# Patient Record
Sex: Male | Born: 1980 | Race: Asian | Hispanic: No | Marital: Married | Smoking: Former smoker
Health system: Southern US, Community
[De-identification: ages and names within clinical notes are randomized; demographics above are authoritative.]

## PROBLEM LIST (undated history)

## (undated) DIAGNOSIS — K219 Gastro-esophageal reflux disease without esophagitis: Secondary | ICD-10-CM

## (undated) HISTORY — DX: Gastro-esophageal reflux disease without esophagitis: K21.9

## (undated) HISTORY — PX: NO PAST SURGERIES: SHX2092

---

## 2010-12-30 ENCOUNTER — Other Ambulatory Visit (HOSPITAL_COMMUNITY): Payer: Self-pay | Admitting: Family Medicine

## 2010-12-30 DIAGNOSIS — R0789 Other chest pain: Secondary | ICD-10-CM

## 2011-01-07 ENCOUNTER — Ambulatory Visit (HOSPITAL_COMMUNITY)
Admission: RE | Admit: 2011-01-07 | Discharge: 2011-01-07 | Disposition: A | Discharge status: Home / Self Care | Payer: Managed Care, Other (non HMO) | Source: Ambulatory Visit | Attending: Family Medicine | Admitting: Family Medicine

## 2011-01-07 ENCOUNTER — Other Ambulatory Visit (HOSPITAL_COMMUNITY): Payer: Self-pay | Admitting: Family Medicine

## 2011-01-07 DIAGNOSIS — R079 Chest pain, unspecified: Secondary | ICD-10-CM | POA: Insufficient documentation

## 2011-01-07 DIAGNOSIS — R0789 Other chest pain: Secondary | ICD-10-CM

## 2011-01-07 DIAGNOSIS — K449 Diaphragmatic hernia without obstruction or gangrene: Secondary | ICD-10-CM | POA: Insufficient documentation

## 2011-01-07 DIAGNOSIS — R12 Heartburn: Secondary | ICD-10-CM | POA: Insufficient documentation

## 2011-01-07 DIAGNOSIS — K219 Gastro-esophageal reflux disease without esophagitis: Secondary | ICD-10-CM | POA: Insufficient documentation

## 2012-06-06 IMAGING — RF DG UGI W/ HIGH DENSITY W/KUB
15 of 24 series · 15 of 24 positions shown · non-contrast
Comparison: None

CLINICAL DATA: Chest pain.  Heartburn.

UPPER GI SERIES WITH KUB
TECHNIQUE: Routine upper GI series was performed with thin and
high density barium.
Fluoroscopy Time: 1.45 minutes

[Series 1: run · 1 of 1 slices shown (1 of 15)]
[im 1/1]
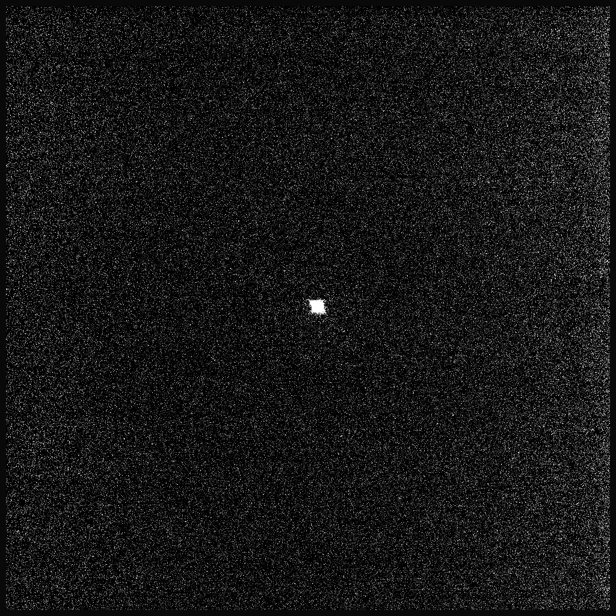

[Series 3: run · 1 of 1 slices shown (2 of 15)]
[im 1/1]
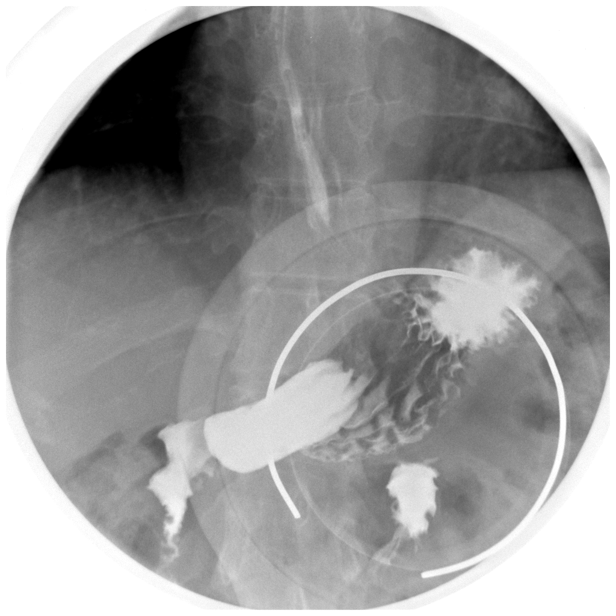

[Series 5: run · 1 of 1 slices shown (3 of 15)]
[im 1/1]
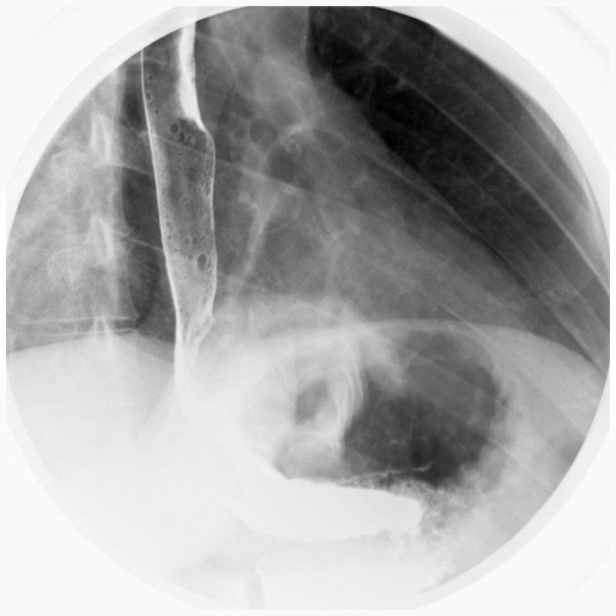

[Series 6: run · 1 of 1 slices shown (4 of 15)]
[im 1/1]
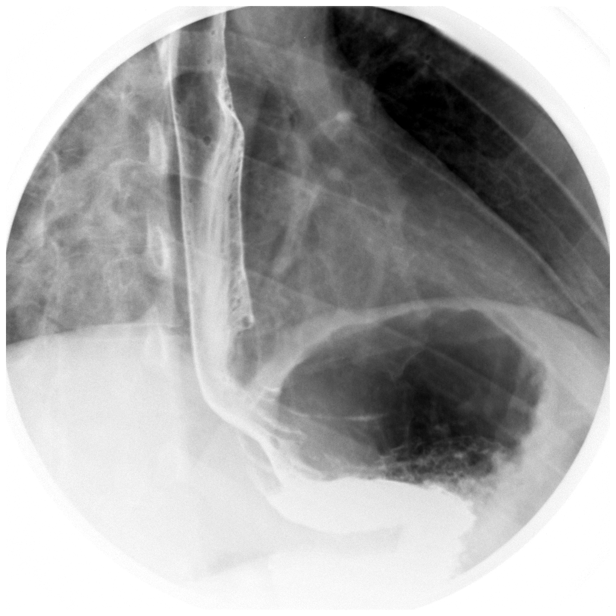

[Series 8: run · 1 of 1 slices shown (5 of 15)]
[im 1/1]
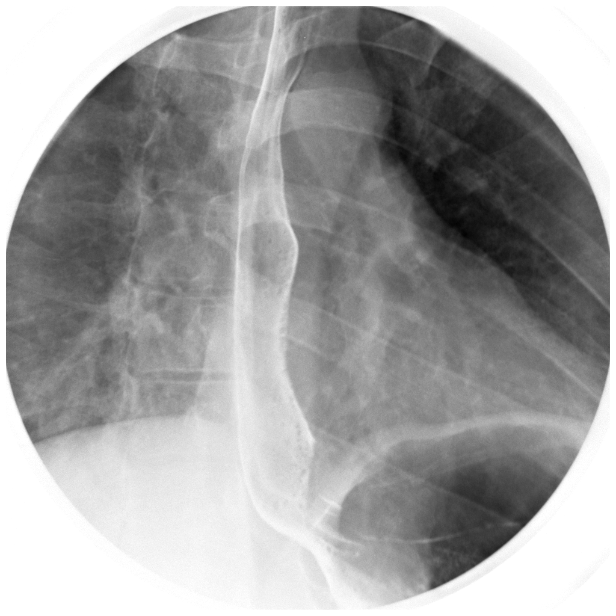

[Series 9: run · 1 of 1 slices shown (6 of 15)]
[im 1/1]
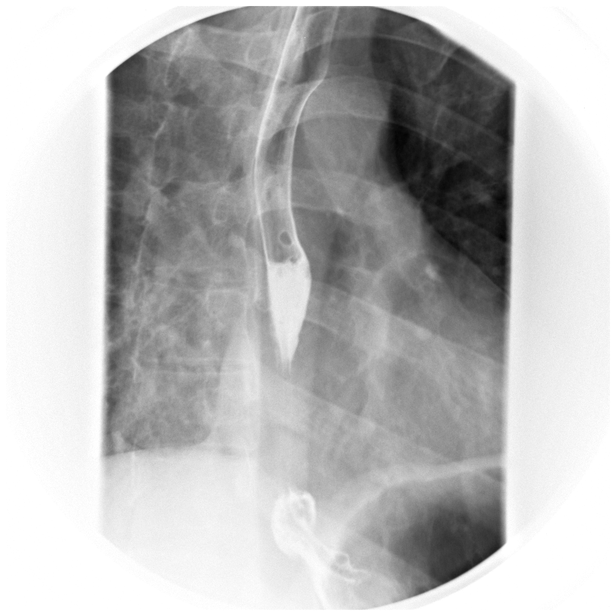

[Series 11: run · 1 of 1 slices shown (7 of 15)]
[im 1/1]
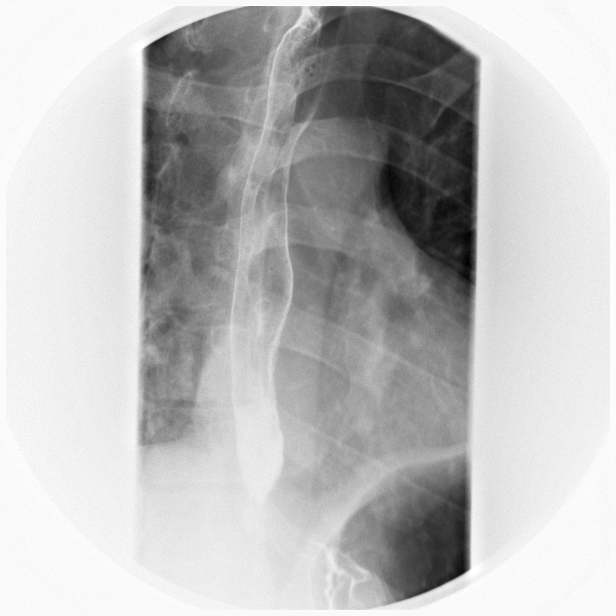

[Series 13: run · 1 of 1 slices shown (8 of 15)]
[im 1/1]
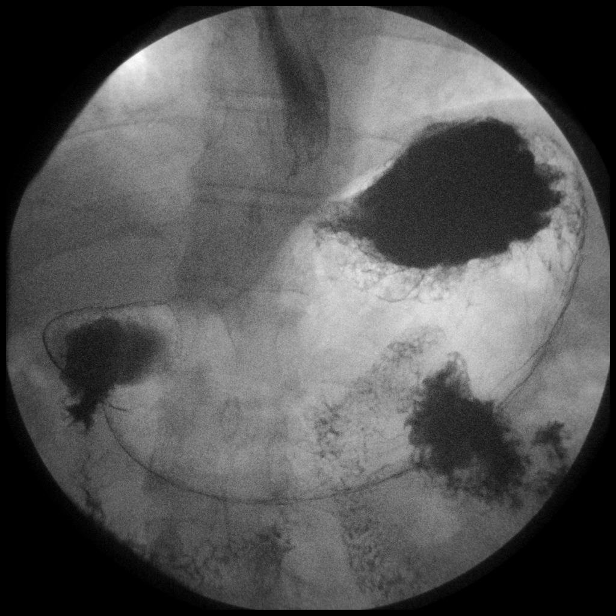

[Series 14: run · 1 of 1 slices shown (9 of 15)]
[im 1/1]
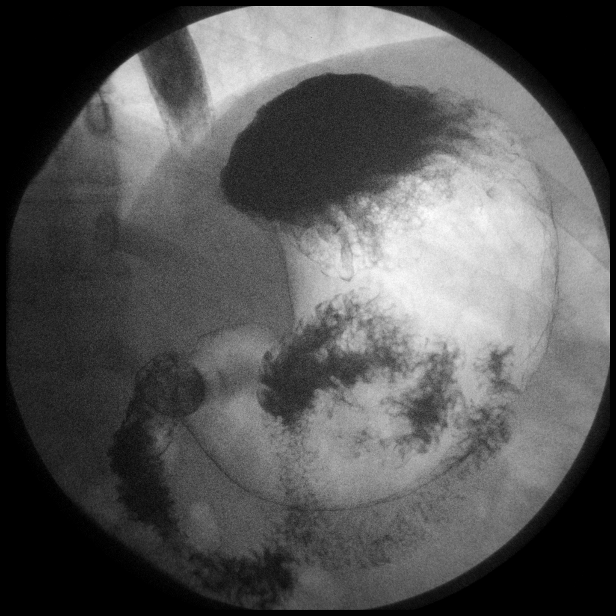

[Series 16: run · 1 of 1 slices shown (10 of 15)]
[im 1/1]
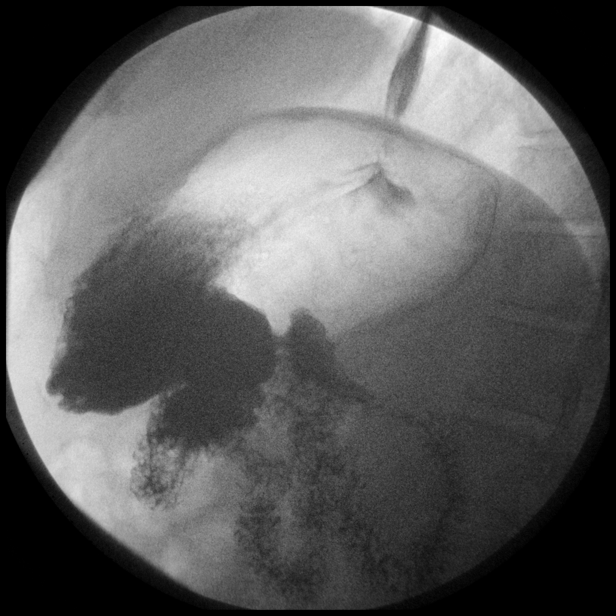

[Series 17: run · 1 of 1 slices shown (11 of 15)]
[im 1/1]
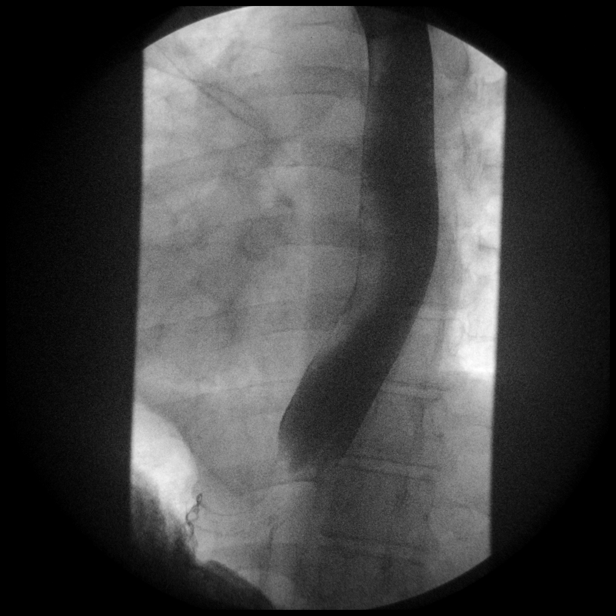

[Series 19: run · 1 of 1 slices shown (12 of 15)]
[im 1/1]
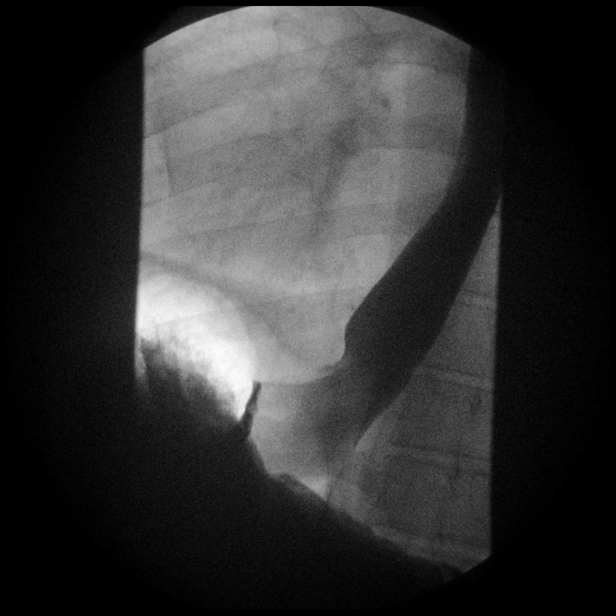

[Series 21: run · 1 of 1 slices shown (13 of 15)]
[im 1/1]
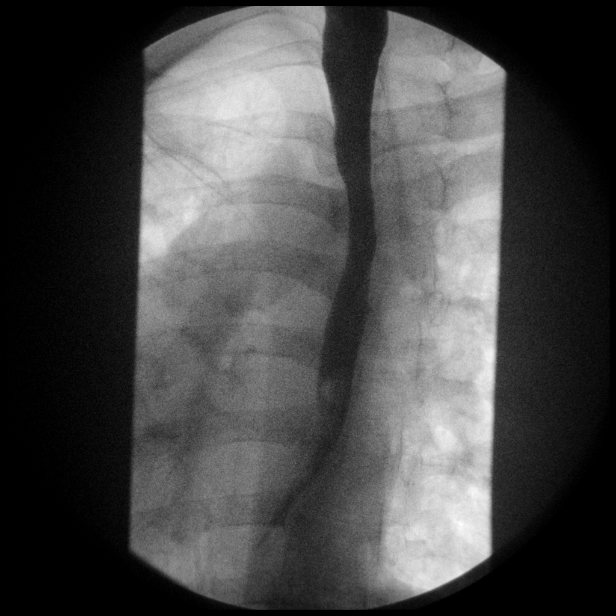

[Series 22: run · 1 of 1 slices shown (14 of 15)]
[im 1/1]
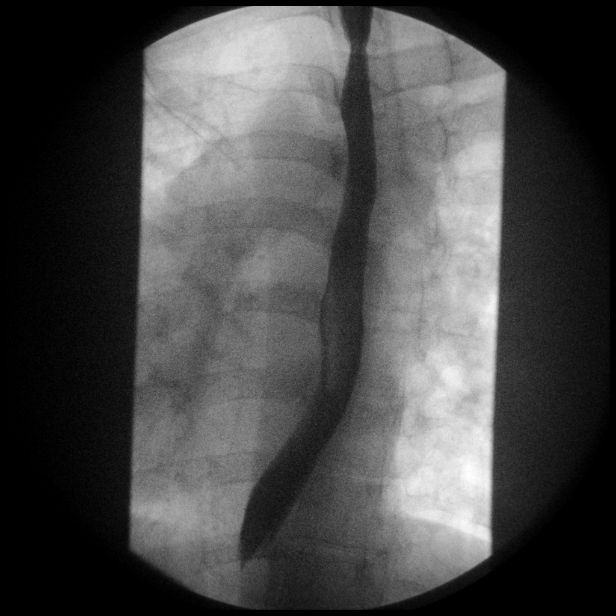

[Series 24: run · 1 of 1 slices shown (15 of 15)]
[im 1/1]
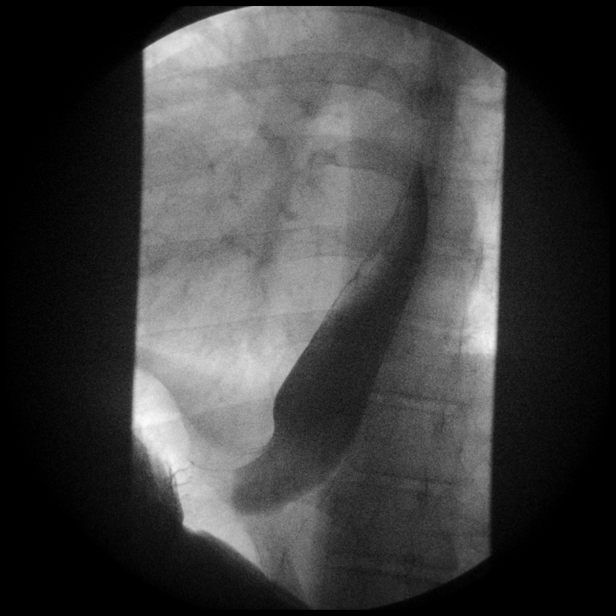

[15 of 24 positions shown; findings below may reference images not displayed]

FINDINGS: Initial barium swallows demonstrate normal esophageal
motility.  No intrinsic or extrinsic lesions of the esophagus are
identified and no mucosal abnormalities are seen.  There is a small
to moderate-sized sliding type hiatal hernia noted with episodes of
spontaneous and inducible GE reflux.

The stomach, duodenal bulb and C-loop are normal.  The duodenal
jejunal junction is in its normal anatomic location and visualized
small bowel loops are normal.
IMPRESSION: Small to moderate-sized sliding type hiatal hernia with spontaneous
and inducible GE reflux.

## 2017-08-02 ENCOUNTER — Ambulatory Visit: Payer: Managed Care, Other (non HMO) | Admitting: Family Medicine

## 2017-08-07 ENCOUNTER — Ambulatory Visit (INDEPENDENT_AMBULATORY_CARE_PROVIDER_SITE_OTHER): Payer: Managed Care, Other (non HMO) | Admitting: Family Medicine

## 2017-08-07 ENCOUNTER — Encounter: Payer: Self-pay | Admitting: Family Medicine

## 2017-08-07 VITALS — BP 104/76 | HR 78 | Temp 98.3°F | Ht 71.0 in | Wt 207.5 lb

## 2017-08-07 DIAGNOSIS — Z23 Encounter for immunization: Secondary | ICD-10-CM | POA: Diagnosis not present

## 2017-08-07 DIAGNOSIS — Z114 Encounter for screening for human immunodeficiency virus [HIV]: Secondary | ICD-10-CM | POA: Diagnosis not present

## 2017-08-07 DIAGNOSIS — L308 Other specified dermatitis: Secondary | ICD-10-CM | POA: Diagnosis not present

## 2017-08-07 DIAGNOSIS — Z0000 Encounter for general adult medical examination without abnormal findings: Secondary | ICD-10-CM | POA: Diagnosis not present

## 2017-08-07 LAB — COMPREHENSIVE METABOLIC PANEL
ALBUMIN: 4.3 g/dL (ref 3.5–5.2)
ALT: 36 U/L (ref 0–53)
AST: 24 U/L (ref 0–37)
Alkaline Phosphatase: 50 U/L (ref 39–117)
BILIRUBIN TOTAL: 0.9 mg/dL (ref 0.2–1.2)
BUN: 18 mg/dL (ref 6–23)
CALCIUM: 9.4 mg/dL (ref 8.4–10.5)
CO2: 28 mEq/L (ref 19–32)
CREATININE: 1.13 mg/dL (ref 0.40–1.50)
Chloride: 104 mEq/L (ref 96–112)
GFR: 77.48 mL/min (ref >60.00)
Glucose, Bld: 86 mg/dL (ref 70–99)
Potassium: 4.2 mEq/L (ref 3.5–5.1)
Sodium: 139 mEq/L (ref 135–145)
TOTAL PROTEIN: 7.4 g/dL (ref 6.0–8.3)

## 2017-08-07 LAB — LIPID PANEL
CHOLESTEROL: 155 mg/dL (ref 0–200)
HDL: 31.9 mg/dL — ABNORMAL LOW (ref >39.00)
LDL Cholesterol: 87 mg/dL (ref 0–99)
NonHDL: 123.45
TRIGLYCERIDES: 183 mg/dL — AB (ref 0.0–149.0)
Total CHOL/HDL Ratio: 5
VLDL: 36.6 mg/dL (ref 0.0–40.0)

## 2017-08-07 MED ORDER — TRIAMCINOLONE ACETONIDE 0.1 % EX OINT: 1.0000 "application " | TOPICAL_OINTMENT | Freq: Two times a day (BID) | CUTANEOUS | 0 refills | Status: AC

## 2017-08-07 MED ORDER — OMEPRAZOLE 20 MG PO CPDR: 20.0000 mg | DELAYED_RELEASE_CAPSULE | Freq: Every day | ORAL | 3 refills | Status: DC

## 2017-08-07 NOTE — Progress Notes (Signed)
Chief Complaint  Patient presents with  . New Patient (Initial Visit)    [name] is here for a complete physical.   His last physical was >1 year ago.  Current diet: in general, a "healthy" diet.   Current exercise: 1-2 x/week will do some cardio Weight trend: stable Does pt snore? Yes. No apneic episodes. Daytime fatigue? No. Seat belt? Yes.    Health maintenance Tetanus- No HIV- No  Past Medical History:  Diagnosis Date  . GERD (gastroesophageal reflux disease)      Past Surgical History:  Procedure Laterality Date  . NO PAST SURGERIES      Medications  Omeprazole 20 mg/d   Allergies No Known Allergies  Family History Family History  Problem Relation Age of Onset  . Diabetes Mother   . Hypertension Father     Review of Systems: Constitutional: no fevers or chills Eye:  no recent significant change in vision Ear/Nose/Mouth/Throat:  Ears:  no tinnitus or hearing loss Nose/Mouth/Throat:  no complaints of nasal congestion, no sore throat Cardiovascular:  no chest pain, no palpitations Respiratory:  no cough and no shortness of breath Gastrointestinal:  no abdominal pain, no change in bowel habits GU:  Male: negative for dysuria, frequency, and incontinence and negative for prostate symptoms Musculoskeletal/Extremities:  no pain, redness, or swelling of the joints Integumentary (Skin/Breast): +eczema on lower back; no abnormal skin lesions reported Neurologic:  no headaches, no numbness, tingling Endocrine: No unexpected weight changes Hematologic/Lymphatic:  no night sweats  Exam BP 104/76 (BP Location: Left Arm, Patient Position: Sitting, Cuff Size: Normal)   Pulse 78   Temp 98.3 F (36.8 C) (Oral)   Ht 5\' 11"  (1.803 m)   Wt 207 lb 8 oz (94.1 kg)   SpO2 97%   BMI 28.94 kg/m  General:  well developed, well nourished, in no apparent distress Skin: +hyperpigmented and scaly patch over cephalad intergluteal cleft; otherwise no significant  moles, warts, or growths Head:  no masses, lesions, or tenderness Eyes:  pupils equal and round, sclera anicteric without injection Ears:  canals without lesions, TMs shiny without retraction, no obvious effusion, no erythema Nose:  nares patent, septum midline, mucosa normal Throat/Pharynx:  lips and gingiva without lesion; tongue and uvula midline; non-inflamed pharynx; no exudates or postnasal drainage Neck: neck supple without adenopathy, thyromegaly, or masses Lungs:  clear to auscultation, breath sounds equal bilaterally, no respiratory distress Cardio:  regular rate and rhythm, no bruits, no LE edema Abdomen:  abdomen soft, nontender; bowel sounds normal; no masses or organomegaly Genital (male): Nml penis, no lesions or discharge; testes present bilaterally without masses or tenderness Rectal: Deferred Musculoskeletal:  symmetrical muscle groups noted without atrophy or deformity Extremities:  no clubbing, cyanosis, or edema, no deformities, no skin discoloration Neuro:  gait normal; deep tendon reflexes normal and symmetric Psych: well oriented with normal range of affect and appropriate judgment/insight  Assessment and Plan  Well adult exam - Plan: Lipid panel, Comprehensive metabolic panel  Screening for HIV (human immunodeficiency virus) - Plan: HIV antibody  Other eczema - Plan: triamcinolone ointment (KENALOG) 0.1 %  Need for tetanus booster - Plan: Tdap vaccine greater than or equal to 7yo IM   Well 37 y.o. male. Counseled on diet and exercise.  Other orders as above. Follow up in 1 year pending the above workup. The patient voiced understanding and agreement to the plan.  Jilda Rocheicholas Paul [place], DO 08/07/17 9:57 AM

## 2017-08-07 NOTE — Patient Instructions (Addendum)
Give us 2-3 business days to get the results of your labs back. If labs are normal, you will likely receive a letter in the mail unless you have MyChart. This can take longer than 2-3 business days.   Do monthly self testicular checks in the shower. You are feeling for lumps/bumps that don't belong. If you feel anything like this, let me know!  Keep the diet clean.  Aim to do some physical exertion for 150 minutes per week. This is typically divided into 5 days per week, 30 minutes per day. The activity should be enough to get your heart rate up. Anything is better than nothing if you have time constraints.  Let us know if you need anything.

## 2017-08-07 NOTE — Progress Notes (Signed)
Pre visit review using our clinic review tool, if applicable. No additional management support is needed unless otherwise documented below in the visit note. 

## 2017-08-08 LAB — HIV ANTIBODY (ROUTINE TESTING W REFLEX): HIV: NONREACTIVE

## 2018-08-26 ENCOUNTER — Other Ambulatory Visit: Payer: Self-pay | Admitting: Family Medicine

## 2018-09-06 ENCOUNTER — Other Ambulatory Visit: Payer: Self-pay | Admitting: Family Medicine

## 2018-09-13 ENCOUNTER — Telehealth: Payer: Self-pay | Admitting: Family Medicine

## 2018-09-26 ENCOUNTER — Other Ambulatory Visit: Payer: Self-pay

## 2018-09-27 ENCOUNTER — Other Ambulatory Visit: Payer: Self-pay | Admitting: Family Medicine

## 2018-09-27 ENCOUNTER — Ambulatory Visit (INDEPENDENT_AMBULATORY_CARE_PROVIDER_SITE_OTHER): Payer: Managed Care, Other (non HMO) | Admitting: Family Medicine

## 2018-09-27 ENCOUNTER — Encounter: Payer: Self-pay | Admitting: Family Medicine

## 2018-09-27 VITALS — BP 102/80 | HR 71 | Temp 97.5°F | Ht 71.0 in | Wt 206.2 lb

## 2018-09-27 DIAGNOSIS — Z23 Encounter for immunization: Secondary | ICD-10-CM

## 2018-09-27 DIAGNOSIS — E785 Hyperlipidemia, unspecified: Secondary | ICD-10-CM

## 2018-09-27 DIAGNOSIS — Z0000 Encounter for general adult medical examination without abnormal findings: Secondary | ICD-10-CM | POA: Diagnosis not present

## 2018-09-27 DIAGNOSIS — L219 Seborrheic dermatitis, unspecified: Secondary | ICD-10-CM | POA: Diagnosis not present

## 2018-09-27 LAB — CBC
HCT: 42.5 % (ref 39.0–52.0)
Hemoglobin: 14.3 g/dL (ref 13.0–17.0)
MCHC: 33.7 g/dL (ref 30.0–36.0)
MCV: 79.8 fl (ref 78.0–100.0)
Platelets: 361 10*3/uL (ref 150.0–400.0)
RBC: 5.33 Mil/uL (ref 4.22–5.81)
RDW: 14.4 % (ref 11.5–15.5)
WBC: 6.4 10*3/uL (ref 4.0–10.5)

## 2018-09-27 LAB — LIPID PANEL
Cholesterol: 161 mg/dL (ref 0–200)
HDL: 31.6 mg/dL — ABNORMAL LOW (ref >39.00)
NonHDL: 129.29
Total CHOL/HDL Ratio: 5
Triglycerides: 239 mg/dL — ABNORMAL HIGH (ref 0.0–149.0)
VLDL: 47.8 mg/dL — ABNORMAL HIGH (ref 0.0–40.0)

## 2018-09-27 LAB — COMPREHENSIVE METABOLIC PANEL
ALT: 41 U/L (ref 0–53)
AST: 22 U/L (ref 0–37)
Albumin: 4.3 g/dL (ref 3.5–5.2)
Alkaline Phosphatase: 64 U/L (ref 39–117)
BUN: 19 mg/dL (ref 6–23)
CO2: 29 mEq/L (ref 19–32)
Calcium: 9.9 mg/dL (ref 8.4–10.5)
Chloride: 105 mEq/L (ref 96–112)
Creatinine, Ser: 1.21 mg/dL (ref 0.40–1.50)
GFR: 66.96 mL/min (ref >60.00)
Glucose, Bld: 87 mg/dL (ref 70–99)
Potassium: 4 mEq/L (ref 3.5–5.1)
Sodium: 141 mEq/L (ref 135–145)
Total Bilirubin: 0.7 mg/dL (ref 0.2–1.2)
Total Protein: 7.3 g/dL (ref 6.0–8.3)

## 2018-09-27 LAB — LDL CHOLESTEROL, DIRECT: Direct LDL: 119 mg/dL

## 2018-09-27 MED ORDER — KETOCONAZOLE 2 % EX SHAM: 1.0000 "application " | MEDICATED_SHAMPOO | CUTANEOUS | 2 refills | Status: DC

## 2018-09-27 MED ORDER — OMEPRAZOLE 20 MG PO CPDR
20.0000 mg | DELAYED_RELEASE_CAPSULE | Freq: Every day | ORAL | 3 refills | Status: DC
Start: 2018-09-27 — End: 2019-10-22

## 2018-09-27 NOTE — Patient Instructions (Addendum)
Give [place] 2-3 business days to get the results of your labs back.   Keep the diet clean and stay active.  Do monthly self testicular checks in the shower. You are feeling for lumps/bumps that don't belong. If you feel anything like this, let me know!  Pepcid (famotidine) 20 mg 1-2 times daily as an alternative to the Prilosec could be helpful.   After the issue on your scalp resolves, you may need to use the shampoo weekly for maintenance.   Let us know if you need anything.

## 2018-09-27 NOTE — Progress Notes (Signed)
Chief Complaint  Patient presents with  . Annual Exam    heart burn    [name] is here for a complete physical.   His last physical was >1 year ago.  Current diet: in general, diet had been better  Current exercise: not much exercise lately, was doing cardio Weight trend: had lost some wt and gained it back Daytime fatigue? No. Seat belt? Yes.    Health maintenance Tetanus- Yes HIV- Yes  Past Medical History:  Diagnosis Date  . GERD (gastroesophageal reflux disease)      Past Surgical History:  Procedure Laterality Date  . NO PAST SURGERIES      Medications  Current Outpatient Medications on File Prior to Visit  Medication Sig Dispense Refill  . omeprazole (PRILOSEC) 20 MG capsule Take 1 capsule (20 mg total) by mouth daily. Needs ov before any more refills 90 capsule 0   Allergies No Known Allergies  Family History Family History  Problem Relation Age of Onset  . Diabetes Mother   . Hypertension Father     Review of Systems: Constitutional: no fevers or chills Eye:  no recent significant change in vision Ear/Nose/Mouth/Throat:  Ears:  no tinnitus or hearing loss Nose/Mouth/Throat:  no complaints of nasal congestion, no sore throat Cardiovascular:  no chest pain, no palpitations Respiratory:  no cough and no shortness of breath Gastrointestinal:  no abdominal pain, no change in bowel habits GU:  Male: negative for dysuria, frequency, and incontinence and negative for prostate symptoms Musculoskeletal/Extremities:  no pain, redness, or swelling of the joints Integumentary (Skin/Breast): +scaly lesion on scalp; otherwise no abnormal skin lesions reported Neurologic:  No current headache Endocrine: No unexpected weight changes Hematologic/Lymphatic:  no night sweats  Exam BP 102/80 (BP Location: Left Arm, Patient Position: Sitting, Cuff Size: Normal)   Pulse 71   Temp (!) 97.5 F (36.4 C) (Temporal)   Ht 5\' 11"  (1.803 m)   Wt 206 lb 4 oz  (93.6 kg)   SpO2 98%   BMI 28.77 kg/m  General:  well developed, well nourished, in no apparent distress Skin: Thickened and mildly hyperpigmented patch over R side of scalp without current scaling or drainage; otherwise no significant moles, warts, or growths Head:  no masses, lesions, or tenderness Eyes:  pupils equal and round, sclera anicteric without injection Ears:  canals without lesions, TMs shiny without retraction, no obvious effusion, no erythema Nose:  nares patent, septum midline, mucosa normal Throat/Pharynx:  lips and gingiva without lesion; tongue and uvula midline; non-inflamed pharynx; no exudates or postnasal drainage Neck: neck supple without adenopathy, thyromegaly, or masses Lungs:  clear to auscultation, breath sounds equal bilaterally, no respiratory distress Cardio:  regular rate and rhythm, no bruits, no LE edema Abdomen:  abdomen soft, nontender; bowel sounds normal; no masses or organomegaly Genital (male): circumcised penis, no lesions or discharge; testes present bilaterally without masses or tenderness Rectal: Deferred Musculoskeletal:  symmetrical muscle groups noted without atrophy or deformity Extremities:  no clubbing, cyanosis, or edema, no deformities, no skin discoloration Neuro:  gait normal; deep tendon reflexes normal and symmetric Psych: well oriented with normal range of affect and appropriate judgment/insight  Assessment and Plan  Well adult exam - Plan: CBC, Comprehensive metabolic panel, Lipid panel  Seborrheic dermatitis of scalp - Plan: ketoconazole (NIZORAL) 2 % shampoo   Well 38 y.o. male. Counseled on diet and exercise. Self testicular checks rec'd.  Other orders as above. Follow up in 1 year pending the  above workup. The patient voiced understanding and agreement to the plan.  Harold Rocheicholas Paul Rodri[place], DO 09/27/18 8:04 AM

## 2018-11-08 ENCOUNTER — Other Ambulatory Visit: Payer: Self-pay

## 2018-11-08 ENCOUNTER — Other Ambulatory Visit: Payer: Managed Care, Other (non HMO)

## 2018-11-08 ENCOUNTER — Other Ambulatory Visit (INDEPENDENT_AMBULATORY_CARE_PROVIDER_SITE_OTHER): Payer: Managed Care, Other (non HMO)

## 2018-11-08 DIAGNOSIS — E785 Hyperlipidemia, unspecified: Secondary | ICD-10-CM

## 2018-11-08 LAB — LIPID PANEL
Cholesterol: 163 mg/dL (ref 0–200)
HDL: 30.6 mg/dL — ABNORMAL LOW (ref >39.00)
LDL Cholesterol: 97 mg/dL (ref 0–99)
NonHDL: 132.01
Total CHOL/HDL Ratio: 5
Triglycerides: 177 mg/dL — ABNORMAL HIGH (ref 0.0–149.0)
VLDL: 35.4 mg/dL (ref 0.0–40.0)

## 2019-09-09 ENCOUNTER — Encounter: Payer: Self-pay | Admitting: Family Medicine

## 2019-09-09 ENCOUNTER — Other Ambulatory Visit: Payer: Self-pay

## 2019-09-09 ENCOUNTER — Ambulatory Visit (INDEPENDENT_AMBULATORY_CARE_PROVIDER_SITE_OTHER): Payer: Managed Care, Other (non HMO) | Admitting: Family Medicine

## 2019-09-09 VITALS — BP 110/82 | HR 83 | Temp 98.1°F | Ht 71.0 in | Wt 207.0 lb

## 2019-09-09 DIAGNOSIS — M25562 Pain in left knee: Secondary | ICD-10-CM | POA: Diagnosis not present

## 2019-09-09 NOTE — Progress Notes (Signed)
Musculoskeletal Exam  Patient: Harold Molina DOB: September 07, 1980  DOS: 09/09/2019  SUBJECTIVE:  Chief Complaint:   Chief Complaint  Patient presents with  . Knee Pain    left    Harold Molina is a 39 y.o.  male for evaluation and treatment of L knee pain.   Onset:  7 months ago. No inj or change in activity.  Location: lateral left knee Character:  sharp  Progression of issue:  has worsened quite a bit recently over past week Associated symptoms: no redness, bruising, swelling, decreased ROM Treatment: to date has been rest.   Neurovascular symptoms: no  Past Medical History:  Diagnosis Date  . GERD (gastroesophageal reflux disease)     Objective: VITAL SIGNS: BP 110/82 (BP Location: Left Arm, Patient Position: Sitting, Cuff Size: Normal)   Pulse 83   Temp 98.1 F (36.7 C) (Oral)   Ht 5\' 11"  (1.803 m)   Wt 207 lb (93.9 kg)   SpO2 99%   BMI 28.87 kg/m  Constitutional: Well formed, well developed. No acute distress. Thorax & Lungs: No accessory muscle use Musculoskeletal: L knee.   Normal active range of motion: yes.   Normal passive range of motion: yes Tenderness to palpation: no Deformity: no Ecchymosis: no Tests positive: None Tests negative: Lachman's, varus/valgus stress, pat app/grind; McMurray's induced pain but no click noted Neurologic: Normal sensory function. Gait nml.  Psychiatric: Normal mood. Age appropriate judgment and insight. Alert & oriented x 3.    Assessment:  Left knee pain, unspecified chronicity  Plan: Stretches/exercises, heat, ice, Tylenol. Suspect PFS. If no improvement over next 3-4 weeks, he will let know and I will refer to sports med for possible [place].  F/u as originally scheduled. The patient voiced understanding and agreement to the plan.   [place] Weston, DO 09/09/19  1:44 PM

## 2019-09-09 NOTE — Patient Instructions (Signed)
Ibuprofen 400-600 mg (2-3 over the counter strength tabs) every 6 hours as needed for pain.  OK to take Tylenol 1000 mg (2 extra strength tabs) or 975 mg (3 regular strength tabs) every 6 hours as needed.  Ice/cold pack over area for 10-15 min twice daily.  Heat (pad or rice pillow in microwave) over affected area, 10-15 minutes twice daily.   Knee Exercises It is normal to feel mild stretching, pulling, tightness, or discomfort as you do these exercises, but you should stop right away if you feel sudden pain or your pain gets worse. STRETCHING AND RANGE OF MOTION EXERCISES  These exercises warm up your muscles and joints and improve the movement and flexibility of your knee. These exercises also help to relieve pain, numbness, and tingling. Exercise A: Knee Extension, Prone  1. Lie on your abdomen on a bed. 2. Place your left / right knee just beyond the edge of the surface so your knee is not on the bed. You can put a towel under your left / right thigh just above your knee for comfort. 3. Relax your leg muscles and allow gravity to straighten your knee. You should feel a stretch behind your left / right knee. 4. Hold this position for 30 seconds. 5. Scoot up so your knee is supported between repetitions. Repeat 2 times. Complete this stretch 3 times per week. Exercise B: Knee Flexion, Active    1. Lie on your back with both knees straight. If this causes back discomfort, bend your left / right knee so your foot is flat on the floor. 2. Slowly slide your left / right heel back toward your buttocks until you feel a gentle stretch in the front of your knee or thigh. 3. Hold this position for 30 seconds. 4. Slowly slide your left / right heel back to the starting position. Repeat 2 times. Complete this exercise 3 times per week. Exercise C: Quadriceps, Prone    1. Lie on your abdomen on a firm surface, such as a bed or padded floor. 2. Bend your left / right knee and hold your ankle.  If you cannot reach your ankle or pant leg, loop a belt around your foot and grab the belt instead. 3. Gently pull your heel toward your buttocks. Your knee should not slide out to the side. You should feel a stretch in the front of your thigh and knee. 4. Hold this position for 30 seconds. Repeat 2 times. Complete this stretch 3 times per week. Exercise D: Hamstring, Supine  1. Lie on your back. 2. Loop a belt or towel over the ball of your left / right foot. The ball of your foot is on the walking surface, right under your toes. 3. Straighten your left / right knee and slowly pull on the belt to raise your leg until you feel a gentle stretch behind your knee. ? Do not let your left / right knee bend while you do this. ? Keep your other leg flat on the floor. 4. Hold this position for 30 seconds. Repeat 2 times. Complete this stretch 3 times per week. STRENGTHENING EXERCISES  These exercises build strength and endurance in your knee. Endurance is the ability to use your muscles for a long time, even after they get tired. Exercise E: Quadriceps, Isometric    1. Lie on your back with your left / right leg extended and your other knee bent. Put a rolled towel or small pillow under your knee if  told by your health care provider. 2. Slowly tense the muscles in the front of your left / right thigh. You should see your kneecap slide up toward your hip or see increased dimpling just above the knee. This motion will push the back of the knee toward the floor. 3. For 3 seconds, keep the muscle as tight as you can without increasing your pain. 4. Relax the muscles slowly and completely. Repeat for 10 total reps Repeat 2 ti mes. Complete this exercise 3 times per week. Exercise F: Straight Leg Raises - Quadriceps  1. Lie on your back with your left / right leg extended and your other knee bent. 2. Tense the muscles in the front of your left / right thigh. You should see your kneecap slide up or see  increased dimpling just above the knee. Your thigh may even shake a bit. 3. Keep these muscles tight as you raise your leg 4-6 inches (10-15 cm) off the floor. Do not let your knee bend. 4. Hold this position for 3 seconds. 5. Keep these muscles tense as you lower your leg. 6. Relax your muscles slowly and completely after each repetition. 10 total reps. Repeat 2 times. Complete this exercise 3 times per week.  Exercise G: Hamstring Curls    If told by your health care provider, do this exercise while wearing ankle weights. Begin with 5 lb weights (optional). Then increase the weight by 1 lb (0.5 kg) increments. Do not wear ankle weights that are more than 20 lbs to start with. 1. Lie on your abdomen with your legs straight. 2. Bend your left / right knee as far as you can without feeling pain. Keep your hips flat against the floor. 3. Hold this position for 3 seconds. 4. Slowly lower your leg to the starting position. Repeat for 10 reps.  Repeat 2 times. Complete this exercise 3 times per week. Exercise H: Squats (Quadriceps)  1. Stand in front of a table, with your feet and knees pointing straight ahead. You may rest your hands on the table for balance but not for support. 2. Slowly bend your knees and lower your hips like you are going to sit in a chair. ? Keep your weight over your heels, not over your toes. ? Keep your lower legs upright so they are parallel with the table legs. ? Do not let your hips go lower than your knees. ? Do not bend lower than told by your health care provider. ? If your knee pain increases, do not bend as low. 3. Hold the squat position for 1 second. 4. Slowly push with your legs to return to standing. Do not use your hands to pull yourself to standing. Repeat 2 times. Complete this exercise 3 times per week. Exercise I: Wall Slides (Quadriceps)    1. Lean your back against a smooth wall or door while you walk your feet out 18-24 inches (46-61 cm) from  it. 2. Place your feet hip-width apart. 3. Slowly slide down the wall or door until your knees Repeat 2 times. Complete this exercise every other day. 4. Exercise K: Straight Leg Raises - Hip Abductors  1. Lie on your side with your left / right leg in the top position. Lie so your head, shoulder, knee, and hip line up. You may bend your bottom knee to help you keep your balance. 2. Roll your hips slightly forward so your hips are stacked directly over each other and your left / right  knee is facing forward. 3. Leading with your heel, lift your top leg 4-6 inches (10-15 cm). You should feel the muscles in your outer hip lifting. ? Do not let your foot drift forward. ? Do not let your knee roll toward the ceiling. 4. Hold this position for 3 seconds. 5. Slowly return your leg to the starting position. 6. Let your muscles relax completely after each repetition. 10 total reps. Repeat 2 times. Complete this exercise 3 times per week. Exercise J: Straight Leg Raises - Hip Extensors  1. Lie on your abdomen on a firm surface. You can put a pillow under your hips if that is more comfortable. 2. Tense the muscles in your buttocks and lift your left / right leg about 4-6 inches (10-15 cm). Keep your knee straight as you lift your leg. 3. Hold this position for 3 seconds. 4. Slowly lower your leg to the starting position. 5. Let your leg relax completely after each repetition. Repeat 2 times. Complete this exercise 3 times per week. Document Released: 11/10/2004 Document Revised: 09/21/2015 Document Reviewed: 11/02/2014 Elsevier Interactive Patient Education  2017 [employer].

## 2019-10-01 ENCOUNTER — Encounter: Payer: Managed Care, Other (non HMO) | Admitting: Family Medicine

## 2019-10-22 ENCOUNTER — Ambulatory Visit (INDEPENDENT_AMBULATORY_CARE_PROVIDER_SITE_OTHER): Payer: Managed Care, Other (non HMO) | Admitting: Family Medicine

## 2019-10-22 ENCOUNTER — Other Ambulatory Visit: Payer: Self-pay

## 2019-10-22 ENCOUNTER — Encounter: Payer: Self-pay | Admitting: Family Medicine

## 2019-10-22 VITALS — BP 108/62 | HR 61 | Temp 97.9°F | Ht 71.0 in | Wt 208.5 lb

## 2019-10-22 DIAGNOSIS — Z23 Encounter for immunization: Secondary | ICD-10-CM

## 2019-10-22 DIAGNOSIS — Z0000 Encounter for general adult medical examination without abnormal findings: Secondary | ICD-10-CM

## 2019-10-22 MED ORDER — CLOBETASOL PROPIONATE 0.05 % EX SHAM: MEDICATED_SHAMPOO | CUTANEOUS | 0 refills | Status: DC

## 2019-10-22 MED ORDER — OMEPRAZOLE 20 MG PO CPDR: 20.0000 mg | DELAYED_RELEASE_CAPSULE | Freq: Every day | ORAL | 3 refills | Status: DC

## 2019-10-22 NOTE — Progress Notes (Signed)
Chief Complaint  Patient presents with  . Annual Exam    [name] is here for a complete physical.   His last physical was >1 year ago.  Current diet: in general, a "healthy" diet.   Current exercise: cycling Weight trend: stable Fatigue out of ordinary? No. Seat belt? Yes.    Health maintenance Tetanus- Yes HIV- Yes Hep C- Yes  Past Medical History:  Diagnosis Date  . GERD (gastroesophageal reflux disease)      Past Surgical History:  Procedure Laterality Date  . NO PAST SURGERIES      Medications  Current Outpatient Medications on File Prior to Visit  Medication Sig Dispense Refill  . omeprazole (PRILOSEC) 20 MG capsule Take 1 capsule (20 mg total) by mouth daily. 90 capsule 3   Allergies No Known Allergies  Family History Family History  Problem Relation Age of Onset  . Diabetes Mother   . Hypertension Father     Review of Systems: Constitutional: no fevers or chills Eye:  no recent significant change in vision Ear/Nose/Mouth/Throat:  Ears:  no hearing loss Nose/Mouth/Throat:  no complaints of nasal congestion, no sore throat Cardiovascular:  no chest pain Respiratory:  no shortness of breath Gastrointestinal:  no abdominal pain, no change in bowel habits GU:  Male: negative for dysuria Musculoskeletal/Extremities:  no new pain of the joints Integumentary (Skin/Breast):  no abnormal skin lesions reported Neurologic:  no headaches Endocrine: No unexpected weight changes Hematologic/Lymphatic:  no night sweats  Exam BP 108/62 (BP Location: Left Arm, Patient Position: Sitting, Cuff Size: Normal)   Pulse 61   Temp 97.9 F (36.6 C) (Oral)   Ht 5\' 11"  (1.803 m)   Wt 208 lb 8 oz (94.6 kg)   SpO2 98%   BMI 29.08 kg/m  General:  well developed, well nourished, in no apparent distress Skin:  no significant moles, warts, or growths Head:  no masses, lesions, or tenderness Eyes:  pupils equal and round, sclera anicteric without  injection Ears:  canals without lesions, TMs shiny without retraction, no obvious effusion, no erythema Nose:  nares patent, septum midline, mucosa normal Throat/Pharynx:  lips and gingiva without lesion; tongue and uvula midline; non-inflamed pharynx; no exudates or postnasal drainage Neck: neck supple without adenopathy, thyromegaly, or masses Lungs:  clear to auscultation, breath sounds equal bilaterally, no respiratory distress Cardio:  regular rate and rhythm, no bruits, no LE edema Abdomen:  abdomen soft, nontender; bowel sounds normal; no masses or organomegaly Rectal: Deferred Musculoskeletal:  symmetrical muscle groups noted without atrophy or deformity Extremities:  no clubbing, cyanosis, or edema, no deformities, no skin discoloration Neuro:  gait normal; deep tendon reflexes normal and symmetric Psych: well oriented with normal range of affect and appropriate judgment/insight  Assessment and Plan  Well adult exam - Plan: Comprehensive metabolic panel, Lipid panel  Need for influenza vaccination - Plan: Flu Vaccine QUAD 6+ mos PF IM (Fluarix Quad PF)   Well 39 y.o. male. Counseled on diet and exercise. Self testicular exams recommended at least monthly.  He had covid vaccine, will send 24 pic of card.  Other orders as above. Follow up in 1 year pending the above workup. The patient voiced understanding and agreement to the plan.  [place] Jewett, DO 10/22/19 8:40 AM

## 2019-10-22 NOTE — Addendum Note (Signed)
Addended by: Mervin Kung A on: 10/22/2019 09:20 AM   Modules accepted: Orders

## 2019-10-22 NOTE — Patient Instructions (Addendum)
Give [place] 2-3 business days to get the results of your labs back.   Keep the diet clean and stay active.  Increase protein intake to 160-165 g daily.  Drink water before meals. Consider adding Metamucil to it as well to help with satiety. Eat slowly.   Do monthly self testicular checks in the shower. You are feeling for lumps/bumps that don't belong. If you feel anything like this, let me know!  Let us know if you need anything.

## 2019-10-23 LAB — LIPID PANEL
Cholesterol: 170 mg/dL (ref <200)
HDL: 33 mg/dL — ABNORMAL LOW (ref >=40)
LDL Cholesterol (Calc): 111 mg/dL (calc) — ABNORMAL HIGH
Non-HDL Cholesterol (Calc): 137 mg/dL (calc) — ABNORMAL HIGH (ref <130)
Total CHOL/HDL Ratio: 5.2 (calc) — ABNORMAL HIGH (ref <5.0)
Triglycerides: 148 mg/dL (ref <150)

## 2019-10-23 LAB — COMPREHENSIVE METABOLIC PANEL
AG Ratio: 1.5 (calc) (ref 1.0–2.5)
ALT: 45 U/L (ref 9–46)
AST: 31 U/L (ref 10–40)
Albumin: 4.5 g/dL (ref 3.6–5.1)
Alkaline phosphatase (APISO): 51 U/L (ref 36–130)
BUN: 14 mg/dL (ref 7–25)
CO2: 25 mmol/L (ref 20–32)
Calcium: 9.5 mg/dL (ref 8.6–10.3)
Chloride: 105 mmol/L (ref 98–110)
Creat: 1.16 mg/dL (ref 0.60–1.35)
Globulin: 3 g/dL (calc) (ref 1.9–3.7)
Glucose, Bld: 102 mg/dL — ABNORMAL HIGH (ref 65–99)
Potassium: 4.3 mmol/L (ref 3.5–5.3)
Sodium: 140 mmol/L (ref 135–146)
Total Bilirubin: 1.4 mg/dL — ABNORMAL HIGH (ref 0.2–1.2)
Total Protein: 7.5 g/dL (ref 6.1–8.1)

## 2020-10-23 ENCOUNTER — Ambulatory Visit (INDEPENDENT_AMBULATORY_CARE_PROVIDER_SITE_OTHER): Payer: Managed Care, Other (non HMO) | Admitting: Family Medicine

## 2020-10-23 ENCOUNTER — Encounter: Payer: Self-pay | Admitting: Family Medicine

## 2020-10-23 ENCOUNTER — Other Ambulatory Visit: Payer: Self-pay

## 2020-10-23 VITALS — BP 122/84 | HR 65 | Temp 98.3°F | Ht 71.0 in | Wt 213.2 lb

## 2020-10-23 DIAGNOSIS — Z23 Encounter for immunization: Secondary | ICD-10-CM | POA: Diagnosis not present

## 2020-10-23 DIAGNOSIS — L219 Seborrheic dermatitis, unspecified: Secondary | ICD-10-CM

## 2020-10-23 DIAGNOSIS — Z0000 Encounter for general adult medical examination without abnormal findings: Secondary | ICD-10-CM | POA: Diagnosis not present

## 2020-10-23 MED ORDER — KETOCONAZOLE 2 % EX SHAM: 1.0000 "application " | MEDICATED_SHAMPOO | CUTANEOUS | 2 refills | Status: DC

## 2020-10-23 MED ORDER — OMEPRAZOLE 20 MG PO CPDR: 20.0000 mg | DELAYED_RELEASE_CAPSULE | Freq: Every day | ORAL | 3 refills | Status: DC

## 2020-10-23 NOTE — Addendum Note (Signed)
Addended by: Scharlene Gloss B on: 10/23/2020 09:16 AM   Modules accepted: Orders

## 2020-10-23 NOTE — Patient Instructions (Addendum)
Give us 2-3 business days to get the results of your labs back.   Keep the diet clean and stay active.  Let us know if you need anything.  Healthy Eating Plan Many factors influence your heart health, including eating and exercise habits. Heart (coronary) risk increases with abnormal blood fat (lipid) levels. Heart-healthy meal planning includes limiting unhealthy fats, increasing healthy fats, and making other small dietary changes. This includes maintaining a healthy body weight to help keep lipid levels within a normal range.  WHAT IS MY PLAN?  Your health care provider recommends that you: Drink a glass of water before meals to help with satiety. Eat slowly. An alternative to the water is to add Metamucil. This will help with satiety as well. It does contain calories, unlike water.  WHAT TYPES OF FAT SHOULD I CHOOSE? Choose healthy fats more often. Choose monounsaturated and polyunsaturated fats, such as olive oil and canola oil, flaxseeds, walnuts, almonds, and seeds. Eat more omega-3 fats. Good choices include salmon, mackerel, sardines, tuna, flaxseed oil, and ground flaxseeds. Aim to eat fish at least two times each week. Avoid foods with partially hydrogenated oils in them. These contain trans fats. Examples of foods that contain trans fats are stick margarine, some tub margarines, cookies, crackers, and other baked goods. If you are going to avoid a fat, this is the one to avoid!  WHAT GENERAL GUIDELINES DO I NEED TO FOLLOW? Check food labels carefully to identify foods with trans fats. Avoid these types of options when possible. Fill one half of your plate with vegetables and green salads. Eat 4-5 servings of vegetables per day. A serving of vegetables equals 1 cup of raw leafy vegetables,  cup of raw or cooked cut-up vegetables, or  cup of vegetable juice. Fill one fourth of your plate with whole grains. Look for the word "whole" as the first word in the ingredient list. Fill  one fourth of your plate with lean protein foods. Eat 4-5 servings of fruit per day. A serving of fruit equals one medium whole fruit,  cup of dried fruit,  cup of fresh, frozen, or canned fruit. Try to avoid fruits in cups/syrups as the sugar content can be high. Eat more foods that contain soluble fiber. Examples of foods that contain this type of fiber are apples, broccoli, carrots, beans, peas, and barley. Aim to get 20-30 g of fiber per day. Eat more home-cooked food and less restaurant, buffet, and fast food. Limit or avoid alcohol. Limit foods that are high in starch and sugar. Avoid fried foods when able. Cook foods by using methods other than frying. Baking, boiling, grilling, and broiling are all great options. Other fat-reducing suggestions include: Removing the skin from poultry. Removing all visible fats from meats. Skimming the fat off of stews, soups, and gravies before serving them. Steaming vegetables in water or broth. Lose weight if you are overweight. Losing just 5-10% of your initial body weight can help your overall health and prevent diseases such as diabetes and heart disease. Increase your consumption of nuts, legumes, and seeds to 4-5 servings per week. One serving of dried beans or legumes equals  cup after being cooked, one serving of nuts equals 1 ounces, and one serving of seeds equals  ounce or 1 tablespoon.  WHAT ARE GOOD FOODS CAN I EAT? Grains Grainy breads (try to find bread that is 3 g of fiber per slice or greater), oatmeal, light popcorn. Whole-grain cereals. Rice and pasta, including brown rice   and those that are made with whole wheat. Edamame pasta is a great alternative to grain pasta. It has a higher protein content. Try to avoid significant consumption of white bread, sugary cereals, or pastries/baked goods.  Vegetables All vegetables. Cooked white potatoes do not count as vegetables.  Fruits All fruits, but limit pineapple and bananas as these  fruits have a higher sugar content.  Meats and Other Protein Sources Lean, well-trimmed beef, veal, pork, and lamb. Chicken and turkey without skin. All fish and shellfish. Wild duck, rabbit, pheasant, and venison. Egg whites or low-cholesterol egg substitutes. Dried beans, peas, lentils, and tofu. Seeds and most nuts.  Dairy Low-fat or nonfat cheeses, including ricotta, string, and mozzarella. Skim or 1% milk that is liquid, powdered, or evaporated. Buttermilk that is made with low-fat milk. Nonfat or low-fat yogurt. Soy/Almond milk are good alternatives if you cannot handle dairy.  Beverages Water is the best for you. Sports drinks with less sugar are more desirable unless you are a highly active athlete.  Sweets and Desserts Sherbets and fruit ices. Honey, jam, marmalade, jelly, and syrups. Dark chocolate.  Eat all sweets and desserts in moderation.  Fats and Oils Nonhydrogenated (trans-free) margarines. Vegetable oils, including soybean, sesame, sunflower, olive, peanut, safflower, corn, canola, and cottonseed. Salad dressings or mayonnaise that are made with a vegetable oil. Limit added fats and oils that you use for cooking, baking, salads, and as spreads.  Other Cocoa powder. Coffee and tea. Most condiments.  The items listed above may not be a complete list of recommended foods or beverages. Contact your dietitian for more options.  

## 2020-10-23 NOTE — Progress Notes (Signed)
Chief Complaint  Patient presents with   Annual Exam    [name] is here for a complete physical.   His last physical was >1 year ago.  Current diet: in general, diet had been better   Current exercise: walking, some wt resistance exercises Weight trend: up a little Fatigue out of ordinary? No. Seat belt? Yes.    Health maintenance Tetanus- Yes HIV- Yes Hep C- Yes  Past Medical History:  Diagnosis Date   GERD (gastroesophageal reflux disease)      Past Surgical History:  Procedure Laterality Date   NO PAST SURGERIES      Medications  Current Outpatient Medications on File Prior to Visit  Medication Sig Dispense Refill   omeprazole (PRILOSEC) 20 MG capsule Take 1 capsule (20 mg total) by mouth daily. 90 capsule 3   Allergies No Known Allergies  Family History Family History  Problem Relation Age of Onset   Diabetes Mother    Hypertension Father     Review of Systems: Constitutional: no fevers or chills Eye:  no recent significant change in vision Ear/Nose/Mouth/Throat:  Ears:  no hearing loss Nose/Mouth/Throat:  no complaints of nasal congestion, no sore throat Cardiovascular:  no chest pain Respiratory:  no shortness of breath Gastrointestinal:  no abdominal pain, no change in bowel habits GU:  Male: negative for dysuria, frequency, and incontinence Musculoskeletal/Extremities:  no pain of the joints Integumentary (Skin/Breast):  no abnormal skin lesions reported Neurologic:  no headaches Endocrine: No unexpected weight changes Hematologic/Lymphatic:  no night sweats  Exam BP 122/84   Pulse 65   Temp 98.3 F (36.8 C) (Oral)   Ht 5\' 11"  (1.803 m)   Wt 213 lb 4 oz (96.7 kg)   SpO2 99%   BMI 29.74 kg/m  General:  well developed, well nourished, in no apparent distress Skin:  no significant moles, warts, or growths Head:  no masses, lesions, or tenderness Eyes:  pupils equal and round, sclera anicteric without injection Ears:  canals  without lesions, TMs shiny without retraction, no obvious effusion, no erythema Nose:  nares patent, septum midline, mucosa normal Throat/Pharynx:  lips and gingiva without lesion; tongue and uvula midline; non-inflamed pharynx; no exudates or postnasal drainage Neck: neck supple without adenopathy, thyromegaly, or masses Lungs:  clear to auscultation, breath sounds equal bilaterally, no respiratory distress Cardio:  regular rate and rhythm, no bruits, no LE edema Abdomen:  abdomen soft, nontender; bowel sounds normal; no masses or organomegaly Rectal: Deferred Musculoskeletal:  symmetrical muscle groups noted without atrophy or deformity Extremities:  no clubbing, cyanosis, or edema, no deformities, no skin discoloration Neuro:  gait normal; deep tendon reflexes normal and symmetric Psych: well oriented with normal range of affect and appropriate judgment/insight  Assessment and Plan  Well adult exam - Plan: Lipid panel, CBC, Comprehensive metabolic panel  Seborrheic dermatitis of scalp - Plan: ketoconazole (NIZORAL) 2 % shampoo   Well 40 y.o. male. Counseled on diet and exercise. Counseled on risks and benefits of prostate cancer screening with PSA. The patient agrees to forego screening.  Other orders as above. Will come back for fasting labs.  Follow up in 1 yr pending the above workup. The patient voiced understanding and agreement to the plan.  [address] [place], DO 10/23/20 9:08 AM

## 2020-10-26 ENCOUNTER — Other Ambulatory Visit (INDEPENDENT_AMBULATORY_CARE_PROVIDER_SITE_OTHER): Payer: Managed Care, Other (non HMO)

## 2020-10-26 ENCOUNTER — Other Ambulatory Visit: Payer: Self-pay

## 2020-10-26 DIAGNOSIS — Z0000 Encounter for general adult medical examination without abnormal findings: Secondary | ICD-10-CM | POA: Diagnosis not present

## 2020-10-26 LAB — COMPREHENSIVE METABOLIC PANEL
ALT: 37 U/L (ref 0–53)
AST: 24 U/L (ref 0–37)
Albumin: 4.4 g/dL (ref 3.5–5.2)
Alkaline Phosphatase: 51 U/L (ref 39–117)
BUN: 15 mg/dL (ref 6–23)
CO2: 30 mEq/L (ref 19–32)
Calcium: 9.6 mg/dL (ref 8.4–10.5)
Chloride: 104 mEq/L (ref 96–112)
Creatinine, Ser: 1.15 mg/dL (ref 0.40–1.50)
GFR: 79.6 mL/min (ref >60.00)
Glucose, Bld: 97 mg/dL (ref 70–99)
Potassium: 4.3 mEq/L (ref 3.5–5.1)
Sodium: 139 mEq/L (ref 135–145)
Total Bilirubin: 1.1 mg/dL (ref 0.2–1.2)
Total Protein: 7.2 g/dL (ref 6.0–8.3)

## 2020-10-26 LAB — LIPID PANEL
Cholesterol: 160 mg/dL (ref 0–200)
HDL: 32.2 mg/dL — ABNORMAL LOW (ref >39.00)
LDL Cholesterol: 96 mg/dL (ref 0–99)
NonHDL: 128.27
Total CHOL/HDL Ratio: 5
Triglycerides: 162 mg/dL — ABNORMAL HIGH (ref 0.0–149.0)
VLDL: 32.4 mg/dL (ref 0.0–40.0)

## 2020-10-26 LAB — CBC
HCT: 43.2 % (ref 39.0–52.0)
Hemoglobin: 14.2 g/dL (ref 13.0–17.0)
MCHC: 32.9 g/dL (ref 30.0–36.0)
MCV: 79 fl (ref 78.0–100.0)
Platelets: 324 10*3/uL (ref 150.0–400.0)
RBC: 5.47 Mil/uL (ref 4.22–5.81)
RDW: 15.1 % (ref 11.5–15.5)
WBC: 5.9 10*3/uL (ref 4.0–10.5)

## 2021-10-25 ENCOUNTER — Encounter: Payer: Self-pay | Admitting: Family Medicine

## 2021-10-25 ENCOUNTER — Ambulatory Visit (INDEPENDENT_AMBULATORY_CARE_PROVIDER_SITE_OTHER): Payer: Managed Care, Other (non HMO) | Admitting: Family Medicine

## 2021-10-25 VITALS — BP 110/70 | HR 69 | Temp 98.2°F | Ht 71.0 in | Wt 209.5 lb

## 2021-10-25 DIAGNOSIS — L219 Seborrheic dermatitis, unspecified: Secondary | ICD-10-CM | POA: Diagnosis not present

## 2021-10-25 DIAGNOSIS — Z23 Encounter for immunization: Secondary | ICD-10-CM | POA: Diagnosis not present

## 2021-10-25 DIAGNOSIS — Z0000 Encounter for general adult medical examination without abnormal findings: Secondary | ICD-10-CM | POA: Diagnosis not present

## 2021-10-25 LAB — LIPID PANEL
Cholesterol: 162 mg/dL (ref 0–200)
HDL: 37.4 mg/dL — ABNORMAL LOW (ref >39.00)
LDL Cholesterol: 94 mg/dL (ref 0–99)
NonHDL: 124.66
Total CHOL/HDL Ratio: 4
Triglycerides: 151 mg/dL — ABNORMAL HIGH (ref 0.0–149.0)
VLDL: 30.2 mg/dL (ref 0.0–40.0)

## 2021-10-25 LAB — COMPREHENSIVE METABOLIC PANEL
ALT: 42 U/L (ref 0–53)
AST: 27 U/L (ref 0–37)
Albumin: 4.6 g/dL (ref 3.5–5.2)
Alkaline Phosphatase: 63 U/L (ref 39–117)
BUN: 14 mg/dL (ref 6–23)
CO2: 29 mEq/L (ref 19–32)
Calcium: 9.5 mg/dL (ref 8.4–10.5)
Chloride: 102 mEq/L (ref 96–112)
Creatinine, Ser: 1.1 mg/dL (ref 0.40–1.50)
GFR: 83.37 mL/min (ref >60.00)
Glucose, Bld: 94 mg/dL (ref 70–99)
Potassium: 4.3 mEq/L (ref 3.5–5.1)
Sodium: 140 mEq/L (ref 135–145)
Total Bilirubin: 1.2 mg/dL (ref 0.2–1.2)
Total Protein: 7.6 g/dL (ref 6.0–8.3)

## 2021-10-25 LAB — CBC
HCT: 42.7 % (ref 39.0–52.0)
Hemoglobin: 14.7 g/dL (ref 13.0–17.0)
MCHC: 34.3 g/dL (ref 30.0–36.0)
MCV: 78 fl (ref 78.0–100.0)
Platelets: 331 10*3/uL (ref 150.0–400.0)
RBC: 5.48 Mil/uL (ref 4.22–5.81)
RDW: 15.7 % — ABNORMAL HIGH (ref 11.5–15.5)
WBC: 6.4 10*3/uL (ref 4.0–10.5)

## 2021-10-25 MED ORDER — CLOBETASOL PROPIONATE 0.05 % EX SOLN: CUTANEOUS | 2 refills | Status: DC

## 2021-10-25 MED ORDER — OMEPRAZOLE 20 MG PO CPDR: 20.0000 mg | DELAYED_RELEASE_CAPSULE | Freq: Every day | ORAL | 3 refills | Status: DC

## 2021-10-25 NOTE — Progress Notes (Signed)
Chief Complaint  Patient presents with   Annual Exam    [name] is here for a complete physical.   His last physical was >1 year ago.  Current diet: in general, a "healthy" diet.   Current exercise: walking, light jogging, stretching Weight trend: stable Fatigue out of ordinary? No. Seat belt? Yes.   Advanced directive? No  Health maintenance Tetanus- Yes HIV- Yes Hep C- Yes  Past Medical History:  Diagnosis Date   GERD (gastroesophageal reflux disease)      Past Surgical History:  Procedure Laterality Date   NO PAST SURGERIES      Medications  Omeprazole 20 mg/d    Allergies No Known Allergies  Family History Family History  Problem Relation Age of Onset   Diabetes Mother    Hypertension Father     Review of Systems: Constitutional: no fevers or chills Eye:  no recent significant change in vision Ear/Nose/Mouth/Throat:  Ears:  no hearing loss Nose/Mouth/Throat:  no complaints of nasal congestion, no sore throat Cardiovascular:  no chest pain Respiratory:  no shortness of breath Gastrointestinal:  no abdominal pain, no change in bowel habits GU:  Male: negative for dysuria, frequency, and incontinence Musculoskeletal/Extremities:  no pain of the joints Integumentary (Skin/Breast):  no abnormal skin lesions reported Neurologic:  no headaches Endocrine: No unexpected weight changes Hematologic/Lymphatic:  no night sweats  Exam BP 110/70 (BP Location: Left Arm, Patient Position: Sitting, Cuff Size: Normal)   Pulse 69   Temp 98.2 F (36.8 C) (Oral)   Ht 5\' 11"  (1.803 m)   Wt 209 lb 8 oz (95 kg)   SpO2 99%   BMI 29.22 kg/m  General:  well developed, well nourished, in no apparent distress Skin:  no significant moles, warts, or growths Head:  no masses, lesions, or tenderness Eyes:  pupils equal and round, sclera anicteric without injection Ears:  canals without lesions, TMs shiny without retraction, no obvious effusion, no  erythema Nose:  nares patent, mucosa normal Throat/Pharynx:  lips and gingiva without lesion; tongue and uvula midline; non-inflamed pharynx; no exudates or postnasal drainage Neck: neck supple without adenopathy, thyromegaly, or masses Lungs:  clear to auscultation, breath sounds equal bilaterally, no respiratory distress Cardio:  regular rate and rhythm, no bruits, no LE edema Abdomen:  abdomen soft, nontender; bowel sounds normal; no masses or organomegaly Rectal: Deferred Musculoskeletal:  symmetrical muscle groups noted without atrophy or deformity Extremities:  no clubbing, cyanosis, or edema, no deformities, no skin discoloration Neuro:  gait normal; deep tendon reflexes normal and symmetric Psych: well oriented with normal range of affect and appropriate judgment/insight  Assessment and Plan  Well adult exam - Plan: CBC, Comprehensive metabolic panel, Lipid panel  Seborrheic dermatitis of scalp - Plan: clobetasol (TEMOVATE) 0.05 % external solution   Well 41 y.o. male. Counseled on diet and exercise. Counseled on risks and benefits of prostate cancer screening with PSA. The patient agrees to forego screening.  Advanced directive form provided today.  Other orders as above. Follow up in 1 yr pending the above workup. The patient voiced understanding and agreement to the plan.  [place], DO 10/25/21 9:09 AM

## 2021-10-25 NOTE — Addendum Note (Signed)
Addended by: Sharon Seller B on: 10/25/2021 09:15 AM   Modules accepted: Orders

## 2021-10-25 NOTE — Patient Instructions (Addendum)
Give [place] 2-3 business days to get the results of your labs back.   Keep the diet clean and stay active.  Please get me a copy of your advanced directive form at your convenience.   Let us know if you need anything.  Foods that may reduce pain: 1) Ginger 2) Blueberries 3) Salmon 4) Pumpkin seeds 5) dark chocolate 6) turmeric 7) tart cherries 8) virgin olive oil 9) chilli peppers 10) mint 11) krill oil

## 2022-10-28 ENCOUNTER — Encounter: Payer: Self-pay | Admitting: Family Medicine

## 2022-10-28 ENCOUNTER — Ambulatory Visit: Payer: Managed Care, Other (non HMO) | Admitting: Family Medicine

## 2022-10-28 VITALS — BP 118/70 | HR 67 | Temp 99.1°F | Ht 71.0 in | Wt 200.1 lb

## 2022-10-28 DIAGNOSIS — Z0000 Encounter for general adult medical examination without abnormal findings: Secondary | ICD-10-CM

## 2022-10-28 DIAGNOSIS — N50811 Right testicular pain: Secondary | ICD-10-CM | POA: Diagnosis not present

## 2022-10-28 LAB — COMPREHENSIVE METABOLIC PANEL
ALT: 35 U/L (ref 0–53)
AST: 25 U/L (ref 0–37)
Albumin: 4.4 g/dL (ref 3.5–5.2)
Alkaline Phosphatase: 56 U/L (ref 39–117)
BUN: 17 mg/dL (ref 6–23)
CO2: 30 meq/L (ref 19–32)
Calcium: 9.8 mg/dL (ref 8.4–10.5)
Chloride: 102 meq/L (ref 96–112)
Creatinine, Ser: 1.06 mg/dL (ref 0.40–1.50)
GFR: 86.55 mL/min (ref >60.00)
Glucose, Bld: 77 mg/dL (ref 70–99)
Potassium: 3.9 meq/L (ref 3.5–5.1)
Sodium: 140 meq/L (ref 135–145)
Total Bilirubin: 1.5 mg/dL — ABNORMAL HIGH (ref 0.2–1.2)
Total Protein: 7.3 g/dL (ref 6.0–8.3)

## 2022-10-28 LAB — CBC
HCT: 43.8 % (ref 39.0–52.0)
Hemoglobin: 14.4 g/dL (ref 13.0–17.0)
MCHC: 32.8 g/dL (ref 30.0–36.0)
MCV: 80.5 fL (ref 78.0–100.0)
Platelets: 309 10*3/uL (ref 150.0–400.0)
RBC: 5.45 Mil/uL (ref 4.22–5.81)
RDW: 14.8 % (ref 11.5–15.5)
WBC: 5.7 10*3/uL (ref 4.0–10.5)

## 2022-10-28 LAB — LIPID PANEL
Cholesterol: 149 mg/dL (ref 0–200)
HDL: 31.9 mg/dL — ABNORMAL LOW (ref >39.00)
LDL Cholesterol: 90 mg/dL (ref 0–99)
NonHDL: 117.44
Total CHOL/HDL Ratio: 5
Triglycerides: 138 mg/dL (ref 0.0–149.0)
VLDL: 27.6 mg/dL (ref 0.0–40.0)

## 2022-10-28 NOTE — Patient Instructions (Addendum)
Give [place] 2-3 business days to get the results of your labs back.   Keep the diet clean and stay active.  Please consider adding some weight resistance exercise to your routine. Consider yoga as well.   Please get me a copy of your advanced directive form at your convenience.   If you change your mind about having an ultrasound of your lower abdominal wall or testicle, please let me know via MyChart.   Let us know if you need anything.

## 2022-10-28 NOTE — Progress Notes (Signed)
Chief Complaint  Patient presents with   Annual Exam    Feels like something stuck in his throat    [name] is here for a complete physical.   His last physical was >1 year ago.  Current diet: in general, a "healthy" diet.   Current exercise: walking, home exercises via a workout app Weight trend: stable Fatigue out of ordinary? No. Seat belt? Yes.   Advanced directive? No  Health maintenance Tetanus- Yes HIV- Yes Hep C- Yes  Past Medical History:  Diagnosis Date   GERD (gastroesophageal reflux disease)      Past Surgical History:  Procedure Laterality Date   NO PAST SURGERIES      Medications  Current Outpatient Medications on File Prior to Visit  Medication Sig Dispense Refill   omeprazole (PRILOSEC) 20 MG capsule Take 1 capsule (20 mg total) by mouth daily. 90 capsule 3    Allergies No Known Allergies  Family History Family History  Problem Relation Age of Onset   Diabetes Mother    Hypertension Father     Review of Systems: Constitutional: no fevers or chills Eye:  no recent significant change in vision Ear/Nose/Mouth/Throat:  Ears:  no hearing loss Nose/Mouth/Throat:  no complaints of nasal congestion, no sore throat Cardiovascular:  no chest pain Respiratory:  no shortness of breath Gastrointestinal:  no abdominal pain, no change in bowel habits GU:  Male: negative for dysuria, frequency, and incontinence; +R testicular pain (intermittent) and R inguinal region Musculoskeletal/Extremities:  no pain of the joints Integumentary (Skin/Breast):  no abnormal skin lesions reported Neurologic:  no headaches Endocrine: No unexpected weight changes Hematologic/Lymphatic:  no night sweats  Exam BP 118/70 (BP Location: Left Arm, Patient Position: Sitting, Cuff Size: Large)   Pulse 67   Temp 99.1 F (37.3 C) (Oral)   Ht 5\' 11"  (1.803 m)   Wt 200 lb 2 oz (90.8 kg)   SpO2 98%   BMI 27.91 kg/m  General:  well developed, well nourished, in  no apparent distress Skin:  no significant moles, warts, or growths Head:  no masses, lesions, or tenderness Eyes:  pupils equal and round, sclera anicteric without injection Ears:  canals without lesions, TMs shiny without retraction, no obvious effusion, no erythema Nose:  nares patent, mucosa normal Throat/Pharynx:  lips and gingiva without lesion; tongue and uvula midline; non-inflamed pharynx; no exudates or postnasal drainage Neck: neck supple without adenopathy, thyromegaly, or masses Lungs:  clear to auscultation, breath sounds equal bilaterally, no respiratory distress Cardio:  regular rate and rhythm, no bruits, no LE edema Abdomen:  abdomen soft, nontender; bowel sounds normal; no masses or organomegaly Rectal: Deferred GU: TTP over R testicle w/o masses or nodules. No hernia.  Musculoskeletal:  symmetrical muscle groups noted without atrophy or deformity Extremities:  no clubbing, cyanosis, or edema, no deformities, no skin discoloration Neuro:  gait normal; deep tendon reflexes normal and symmetric Psych: well oriented with normal range of affect and appropriate judgment/insight  Assessment and Plan  Well adult exam - Plan: CBC, Comprehensive metabolic panel, Lipid panel  Right testicular pain   Well 42 y.o. male. Counseled on diet and exercise. Wt resistance exercise rec'd.  Counseled on risks and benefits of prostate cancer screening with PSA. The patient agrees to forego screening.  Advanced directive form provided today.  For pain, rec'd [place] of inguinal region and testicular area. He politely declined for now and will let us know if he changes his mind. He is  caring for his wife who is going thru breast cancer. I suspect he has a small fat hernia pressing against his nerve.  Other orders as above. Follow up in 1 yr pending the above workup. The patient voiced understanding and agreement to the plan.  Jilda Roche [place], DO 10/28/22 8:59 AM

## 2022-12-25 ENCOUNTER — Other Ambulatory Visit: Payer: Self-pay | Admitting: Family Medicine

## 2023-08-18 ENCOUNTER — Ambulatory Visit: Payer: Self-pay | Admitting: *Deleted

## 2023-08-18 ENCOUNTER — Encounter (HOSPITAL_BASED_OUTPATIENT_CLINIC_OR_DEPARTMENT_OTHER): Payer: Self-pay | Admitting: Emergency Medicine

## 2023-08-18 ENCOUNTER — Other Ambulatory Visit: Payer: Self-pay

## 2023-08-18 ENCOUNTER — Emergency Department (HOSPITAL_BASED_OUTPATIENT_CLINIC_OR_DEPARTMENT_OTHER)

## 2023-08-18 ENCOUNTER — Emergency Department (HOSPITAL_BASED_OUTPATIENT_CLINIC_OR_DEPARTMENT_OTHER)
Admission: EM | Admit: 2023-08-18 | Discharge: 2023-08-18 | Disposition: A | Discharge status: Home / Self Care | Attending: Emergency Medicine | Admitting: Emergency Medicine

## 2023-08-18 DIAGNOSIS — Z87891 Personal history of nicotine dependence: Secondary | ICD-10-CM | POA: Insufficient documentation

## 2023-08-18 DIAGNOSIS — R29810 Facial weakness: Secondary | ICD-10-CM | POA: Diagnosis present

## 2023-08-18 DIAGNOSIS — G510 Bell's palsy: Secondary | ICD-10-CM | POA: Insufficient documentation

## 2023-08-18 LAB — COMPREHENSIVE METABOLIC PANEL WITH GFR
ALT: 22 U/L (ref 0–44)
AST: 27 U/L (ref 15–41)
Albumin: 4.5 g/dL (ref 3.5–5.0)
Alkaline Phosphatase: 67 U/L (ref 38–126)
Anion gap: 11 (ref 5–15)
BUN: 10 mg/dL (ref 6–20)
CO2: 26 mmol/L (ref 22–32)
Calcium: 9.1 mg/dL (ref 8.9–10.3)
Chloride: 104 mmol/L (ref 98–111)
Creatinine, Ser: 1.03 mg/dL (ref 0.61–1.24)
GFR, Estimated: >60 mL/min (ref >60)
Glucose, Bld: 122 mg/dL — ABNORMAL HIGH (ref 70–99)
Potassium: 3.7 mmol/L (ref 3.5–5.1)
Sodium: 141 mmol/L (ref 135–145)
Total Bilirubin: 0.6 mg/dL (ref 0.0–1.2)
Total Protein: 7.6 g/dL (ref 6.5–8.1)

## 2023-08-18 LAB — CBC
HCT: 43 % (ref 39.0–52.0)
Hemoglobin: 15.1 g/dL (ref 13.0–17.0)
MCH: 27.7 pg (ref 26.0–34.0)
MCHC: 35.1 g/dL (ref 30.0–36.0)
MCV: 78.9 fL — ABNORMAL LOW (ref 80.0–100.0)
Platelets: 295 K/uL (ref 150–400)
RBC: 5.45 MIL/uL (ref 4.22–5.81)
RDW: 13.7 % (ref 11.5–15.5)
WBC: 5.7 K/uL (ref 4.0–10.5)
nRBC: 0 % (ref 0.0–0.2)

## 2023-08-18 LAB — APTT: aPTT: 32 s (ref 24–36)

## 2023-08-18 LAB — PROTIME-INR
INR: 1.1 (ref 0.8–1.2)
Prothrombin Time: 14.6 s (ref 11.4–15.2)

## 2023-08-18 LAB — URINE DRUG SCREEN
Amphetamines: NOT DETECTED
Barbiturates: NOT DETECTED
Benzodiazepines: NOT DETECTED
Cocaine: NOT DETECTED
Fentanyl: NOT DETECTED
Methadone Scn, Ur: NOT DETECTED
Opiates: NOT DETECTED
Tetrahydrocannabinol: NOT DETECTED

## 2023-08-18 LAB — DIFFERENTIAL
Abs Immature Granulocytes: 0.02 K/uL (ref 0.00–0.07)
Basophils Absolute: 0 K/uL (ref 0.0–0.1)
Basophils Relative: 1 %
Eosinophils Absolute: 0.1 K/uL (ref 0.0–0.5)
Eosinophils Relative: 1 %
Immature Granulocytes: 0 %
Lymphocytes Relative: 29 %
Lymphs Abs: 1.7 K/uL (ref 0.7–4.0)
Monocytes Absolute: 0.2 K/uL (ref 0.1–1.0)
Monocytes Relative: 4 %
Neutro Abs: 3.7 K/uL (ref 1.7–7.7)
Neutrophils Relative %: 65 %

## 2023-08-18 LAB — ETHANOL: Alcohol, Ethyl (B): <15 mg/dL (ref <15)

## 2023-08-18 MED ORDER — PREDNISONE 10 MG PO TABS: 40.0000 mg | ORAL_TABLET | Freq: Two times a day (BID) | ORAL | 0 refills | Status: AC

## 2023-08-18 MED ORDER — VALACYCLOVIR HCL 1 G PO TABS: 1000.0000 mg | ORAL_TABLET | Freq: Three times a day (TID) | ORAL | 0 refills | Status: AC

## 2023-08-18 MED ORDER — IOHEXOL 350 MG/ML SOLN
50.0000 mL | Freq: Once | INTRAVENOUS | Status: AC | PRN
  Administered 2023-08-18: 50 mL via INTRAVENOUS

## 2023-08-18 NOTE — Telephone Encounter (Signed)
 [facility]  Advised ED

## 2023-08-18 NOTE — Telephone Encounter (Signed)
 Copied from CRM [id number]. Topic: Clinical - Red Word Triage >> Aug 18, 2023 10:53 AM Viola FALCON wrote: Red Word that prompted transfer to Nurse Triage: Patient right side of face is numb, sharp pain right above neck, eye lids are swollen, requested appt Reason for Disposition  Patient sounds very sick or weak to the triagerBoth    Right side of face is numb and getting worse since he woke up this morning.   Also having sharp pain in the back of his head ear his right earlobe and near his neck.  Answer Assessment - Initial Assessment Questions 1. ONSET: When did the pain start? (e.g., minutes, hours, days)     Numbness in right side of my face.   I woke up like this this morning.   Yesterday my face hurt.  I took Advil for it and felt better.  I thought maybe I slept funny but it's getting worse over time.   I'm having a sharp pain in the back of my head close to my earlobe near my neck too.    Like someone is stabbing me there.  My right eye is swollen and watery too.    My vision in the right eye is blurry too. I referred him to the ED.  He is agreeable to going and going to the [facility] ED on [address].   His wife is going to take him.    2. ONSET: Does the pain come and go, or has it been constant since it started? (e.g., constant, intermittent, fleeting)     I woke up like this this morning. Right vision is blurry.  I went on a walk and was fine this morning with my vision.  I asked him not to drive himself to the ED.   His wife is going to take him.    My right eyelid is swollen.   It's watery too.    3. SEVERITY: How bad is the pain? (Scale 1-10; mild, moderate or severe)     Sharp pain like someone is stabbing the back of my head near my earlobe by my neck. 4. LOCATION: Where does it hurt?      See above 5. RASH: Is there any redness, rash, or swelling of your face?     No 6. FEVER: Do you have a fever? If Yes, ask: What is it, how was it measured, and when did it  start?      No 7. OTHER SYMPTOMS: Do you have any other symptoms? (e.g., fever, toothache, nasal discharge, nasal congestion, clicking sensation in jaw joint)     See above 8. PREGNANCY: Is there any chance you are pregnant? When was your last menstrual period?     N/A  Protocols used: Face Pain-A-AH FYI Only or Action Required?: FYI only for provider.  Patient was last seen in primary care on 10/28/2022 by Frann Mabel [place], DO.  Called Nurse Triage reporting Facial Pain.Right side of face is numb and getting worse with sharp pain in the back of his head near his right earlobe and near his neck like someone is stabbing me.  Symptoms began today. When he woke up this morning  Interventions attempted: Nothing.  Symptoms are: rapidly worsening.Numbness is spreading/getting worse in right side of his face.  Triage Disposition: Go to ED Now (or PCP Triage)  Patient/caregiver understands and will follow disposition?: Yes

## 2023-08-18 NOTE — ED Notes (Signed)
 ED Provider at bedside.

## 2023-08-18 NOTE — ED Notes (Signed)
 Patient transported to CT

## 2023-08-18 NOTE — ED Provider Notes (Signed)
 I provided a substantive portion of the care of this patient.  I personally made/approved the management plan for this patient and take responsibility for the patient management.  EKG Interpretation Date/Time:  Friday August 18 2023 12:21:00 EDT Ventricular Rate:  72 PR Interval:  168 QRS Duration:  79 QT Interval:  378 QTC Calculation: 414 R Axis:   69  Text Interpretation: Sinus rhythm normal, no old comparison Confirmed by Armenta Canning [phone]) on 08/18/2023 12:56:25 PM   Patient has some pain behind his right ear in the area of the mastoid and also up a bit higher in the scalp.  Pain had sharp quality to it he also noted some more aching diffuse pain of his face.  This morning he awakened to have drooping of the right side of the mouth and his eye not closing appropriately.  Patient has not noted any fever or rash.  Patient is alert nontoxic.  Extraocular motions intact.  No visible rash or lesions on the scalp periauricular area or face.  Droop of the mouth on the right.  Right eye is not closed completely.  Patient is otherwise clinically well in appearance with no other neurologic symptoms.  Ear canal and TM normal on the right.  Airway is clear without any lesions.  CT angio negative.  With reported facial pain and onset of facial nerve dysfunction consistent with Bell's palsy, will opt to treat with combination of Valtrex  and prednisone .   Armenta Canning, MD 08/18/23 1438

## 2023-08-18 NOTE — ED Triage Notes (Signed)
 Pt reports decreased sensation to R side of face since waking up this AM, along with swelling. Also c/o sharp pain behind R ear . R eye blurred vision due to eye dryness from difficulty closing eyelid.   Reports BL upper facial pain yesterday. Denies hx of migraines, diabetes.  LKW midnight.

## 2023-08-18 NOTE — Discharge Instructions (Addendum)
 Follow up with your primary care provider. Take Prednisone  and Valtrex  as prescribed. Also recommend using a preservative free natural tears drop to the right eye. Use a paper tape to tape the eye closed at night.   Return to the ER for any worsening or concerning symptoms.

## 2023-08-18 NOTE — ED Provider Notes (Signed)
 [facility] Provider Note   CSN: [id number] Arrival date & time: 08/18/23  1213     Patient presents with: Facial Swelling   Harold Molina is a 43 y.o. male.   43 year old male presents with concern for right side facial weakness, diminished sensation and pain behind his right ear extending down his right neck.  He states that yesterday he had some pain in his face, took over-the-counter pain reliever with improvement.  Last known well of midnight, woke up this morning with right-sided facial weakness and this discomfort behind the ear to the neck which was progressively worsening in both regards.  He called his PCP who directed him to the ER.  He also notes that he has some slurred speech.  No word finding difficulties, no upper or lower extremity weakness or altered sensation.  Reports prior history of elevated cholesterol 2 years ago, made dietary changes and cholesterol was improved at last screen.  No history of hypertension or diabetes.  Patient is a non-smoker (quit chewing tobacco in 2018).  He takes omeprazole  for GERD.       Prior to Admission medications   Medication Sig Start Date End Date Taking? Authorizing Provider  Multiple Vitamin (MULTIVITAMIN ADULT PO) Take 1 tablet by mouth daily.   Yes [provider]  omeprazole  (PRILOSEC) 20 MG capsule TAKE 1 CAPSULE BY MOUTH EVERY DAY 12/26/22  Yes Wendling, Mabel Mt, DO  predniSONE  (DELTASONE ) 10 MG tablet Take 4 tablets (40 mg total) by mouth 2 (two) times daily for 7 days. 08/18/23 08/25/23 Yes Beverley Leita LABOR, PA-C  valACYclovir  (VALTREX ) 1000 MG tablet Take 1 tablet (1,000 mg total) by mouth 3 (three) times daily for 7 days. 08/18/23 08/25/23 Yes Beverley Leita LABOR, PA-C    Allergies: Patient has no known allergies.    Review of Systems Negative except as per HPI Updated Vital Signs BP (!) 128/91   Pulse 65   Temp 98.1 F (36.7 C) (Oral)   Resp 12   Ht 5' 11 (1.803 m)    Wt 83.9 kg   SpO2 100%   BMI 25.80 kg/m   Physical Exam Vitals and nursing note reviewed.  Constitutional:      General: He is not in acute distress.    Appearance: He is well-developed. He is not diaphoretic.  HENT:     Head: Normocephalic and atraumatic.     Nose: Nose normal.     Mouth/Throat:     Mouth: Mucous membranes are moist.  Eyes:     Extraocular Movements: Extraocular movements intact.     Conjunctiva/sclera: Conjunctivae normal.     Pupils: Pupils are equal, round, and reactive to light.  Neck:   Cardiovascular:     Pulses: Normal pulses.  Pulmonary:     Effort: Pulmonary effort is normal.  Musculoskeletal:     Cervical back: Tenderness present.     Right lower leg: No edema.     Left lower leg: No edema.  Skin:    General: Skin is warm and dry.     Findings: No erythema or rash.  Neurological:     Mental Status: He is alert and oriented to person, place, and time.     GCS: GCS eye subscore is 4. GCS verbal subscore is 5. GCS motor subscore is 6.     Cranial Nerves: Facial asymmetry present.     Motor: Motor function is intact. No weakness, abnormal muscle tone or pronator drift.  Coordination: Finger-Nose-Finger Test and Heel to [place] Test normal.     Gait: Gait is intact.     Comments: Right side facial weakness noted (loss of smile lines on right compared to left, naso/labial fold). Does not entirely close right eye.  Is able to raise right eyebrow although is weak compared to left.  Diminished sensation to right forehead and right check, normal sensation to right mandible.   Psychiatric:        Behavior: Behavior normal.     (all labs ordered are listed, but only abnormal results are displayed) Labs Reviewed  CBC - Abnormal; Notable for the following components:      Result Value   MCV 78.9 (*)    All other components within normal limits  COMPREHENSIVE METABOLIC PANEL WITH GFR - Abnormal; Notable for the following components:   Glucose, Bld  122 (*)    All other components within normal limits  ETHANOL  PROTIME-INR  APTT  DIFFERENTIAL  URINE DRUG SCREEN    EKG: EKG Interpretation Date/Time:  Friday August 18 2023 12:21:00 EDT Ventricular Rate:  72 PR Interval:  168 QRS Duration:  79 QT Interval:  378 QTC Calculation: 414 R Axis:   69  Text Interpretation: Sinus rhythm normal, no old comparison Confirmed by Armenta Canning [phone]) on 08/18/2023 12:56:25 PM  Radiology: CT Angio Head Neck W WO CM Result Date: 08/18/2023 EXAM: CTA Head and Neck with Intravenous Contrast. CT Head without Contrast. CLINICAL HISTORY: TECHNIQUE: Axial CTA images of the head and neck performed with intravenous contrast. MIP reconstructed images were created and reviewed. Axial computed tomography images of the head/brain performed without intravenous contrast. Note: Per PQRS, the description of internal carotid artery percent stenosis, including 0 percent or normal exam, is based on [place] American Symptomatic Carotid Endarterectomy Trial (NASCET) criteria. Dose reduction technique was used including one or more of the following: automated exposure control, adjustment of mA and kV according to patient size, and/or iterative reconstruction. CONTRAST: With; COMPARISON: None provided. FINDINGS: CT HEAD: BRAIN: The brain appears normal. No evidence of hemorrhage, mass, cortical infarct or hydrocephalus. VENTRICLES: No hydrocephalus. ORBITS: The orbits are unremarkable. SINUSES AND MASTOIDS: Minimal mucosal disease within the floor of the right maxillary sinus. The remaining paranasal sinuses and mastoid air cells are clear. CTA NECK: COMMON CAROTID ARTERIES: The common carotid arteries are normal in caliber and unremarkable bilaterally. INTERNAL CAROTID ARTERIES: The cervical segments of the internal carotid arteries are normal in caliber and unremarkable bilaterally. The cranial and cavernous segments of the internal carotid arteries are normal in caliber  bilaterally. VERTEBRAL ARTERIES: The vertebral arteries are codominant and normal in caliber. CTA HEAD: ANTERIOR CEREBRAL ARTERIES: No significant stenosis. No occlusion. No aneurysm. MIDDLE CEREBRAL ARTERIES: No significant stenosis. No occlusion. No aneurysm. POSTERIOR CEREBRAL ARTERIES: No significant stenosis. No occlusion. No aneurysm. BASILAR ARTERY: No significant stenosis. No occlusion. No aneurysm. OTHER: The vertebral basilar system is unremarkable. The posterior cerebral arteries and the cerebellar arteries are normal in caliber. The brain remains normal in appearance following administration of intravenous contrast. SOFT TISSUES: No acute finding. No masses or lymphadenopathy. BONES: No acute osseous abnormality. IMPRESSION: 1. No acute intracranial hemorrhage, ischemic change, or mass. 2. No evidence of significant stenosis, aneurysmal dilatation, or dissection involving the arteries of the head and neck. 3. Minimal mucosal disease within the floor of the right maxillary sinus. Electronically signed by: evalene coho 08/18/2023 01:51 PM EDT RP Workstation: [id number]     Procedures   Medications  Ordered in the ED  iohexol  (OMNIPAQUE ) 350 MG/ML injection 50 mL (50 mLs Intravenous Contrast Given 08/18/23 1342)                                    Medical Decision Making Amount and/or Complexity of Data Reviewed Labs: ordered. Radiology: ordered.  Risk Prescription drug management.   This patient presents to the ED for concern of right side facial weakness/altered sensation, right side neck pain, this involves an extensive number of treatment options, and is a complaint that carries with it a high risk of complications and morbidity.  The differential diagnosis includes but not limited to bells palsy, CVA< dissection, zoster, MSK pain   Co morbidities / Chronic conditions that complicate the patient evaluation  Former tobacco use, elevated lipids improved with diet,  GERD   Additional history obtained:  Additional history obtained from EMR External records from outside source obtained and reviewed including prior labs on file   Lab Tests:  I Ordered, and personally interpreted labs.  The pertinent results include: CBC without significant findings.  CMP without severe findings.  INR normal.  PTT normal UDS negative, alcohol negative.   Imaging Studies ordered:  I ordered imaging studies including CTA head and neck I independently visualized and interpreted imaging which showed no acute abnormality I agree with the radiologist interpretation   Cardiac Monitoring: / EKG:  The patient was maintained on a cardiac monitor.  I personally viewed and interpreted the cardiac monitored which showed an underlying rhythm of: Sinus rhythm, rate 72   Problem List / ED Course / Critical interventions / Medication management  43 year old male presents with concern for diminished sensation and weakness of the right side of the face as above.  He also has some pain in the right side of his neck and posterior right ear.  There is no overlying lesions or skin changes in this area.  CTA head and neck without acute abnormality, labs reassuring.  Suspect likely Bell's palsy, to be managed with Valtrex , prednisone , recommend natural tears drops to eye and taping eye shut at night and follow-up with PCP.  Case was discussed with attending who has seen the patient and agrees with plan of care. I have reviewed the patients home medicines and have made adjustments as needed   Consultations Obtained:  I requested consultation with the ER attending Dr. Armenta,  and discussed lab and imaging findings as well as pertinent plan - they recommend: agrees with plan of care, recommends prednisone  and valtrex     Social Determinants of Health:  Has PCP   Test / Admission - Considered:  Stable for dc      Final diagnoses:  Bell's palsy    ED Discharge Orders           Ordered    valACYclovir  (VALTREX ) 1000 MG tablet  3 times daily        08/18/23 1436    predniSONE  (DELTASONE ) 10 MG tablet  2 times daily        08/18/23 1436               Beverley Leita LABOR, PA-C 08/18/23 1452    Armenta Canning, MD 08/24/23 [phone]

## 2023-08-21 ENCOUNTER — Encounter: Payer: Self-pay | Admitting: Family Medicine

## 2023-08-23 ENCOUNTER — Ambulatory Visit: Payer: Self-pay

## 2023-08-23 NOTE — Telephone Encounter (Signed)
 FYI Only or Action Required?: Action required by provider: clinical question for provider.  Patient was last seen in primary care on 10/28/2022 by Frann Mabel Mt, DO.  Called Nurse Triage reporting Medication Problem.  Symptoms began several days ago.  Interventions attempted: Nothing.  Symptoms are: unchanged.  Triage Disposition: Call PCP Now  Patient/caregiver understands and will follow disposition?: Yes, will follow disposition  Copied from CRM #[id number]. Topic: Clinical - Red Word Triage >> Aug 23, 2023  3:01 PM Deleta RAMAN wrote: Red Word that prompted transfer to Nurse Triage: medication is making condition worse patient was seen in the er on 8/8. He has dizziness, lower back pain, and cloudy brain. Very low energy Reason for Disposition  [1] Caller has URGENT medicine question about med that primary care doctor (or NP/PA) or specialist prescribed AND [2] triager unable to answer question  Answer Assessment - Initial Assessment Questions 1. NAME of MEDICINE: What medicine(s) are you calling about?     Valacyclovir  and prednisone  2. QUESTION: What is your question? (e.g., double dose of medicine, side effect)     States that he doesn't know if he should suck it up or if he can take the medications less. Pt states that he has never been on either medication independently. States that he would like to know what to do as the medications cause him to have low energy, dizziness, low back pain and cloudy brain 3. PRESCRIBER: Who prescribed the medicine? Reason: if prescribed by specialist, call should be referred to that group.     ED  Protocols used: Medication Question Call-A-AH

## 2023-08-24 NOTE — Telephone Encounter (Signed)
 Pt called and scheduled

## 2023-08-29 ENCOUNTER — Ambulatory Visit (INDEPENDENT_AMBULATORY_CARE_PROVIDER_SITE_OTHER): Admitting: Family Medicine

## 2023-08-29 VITALS — BP 130/86 | HR 95 | Temp 98.0°F | Resp 16 | Ht 71.0 in | Wt 187.4 lb

## 2023-08-29 DIAGNOSIS — G510 Bell's palsy: Secondary | ICD-10-CM

## 2023-08-29 NOTE — Patient Instructions (Signed)
 90% of people who get this wrap up by week 3 or are significantly improved.   If you do not hear anything about your referral in the next 1-2 weeks, call our office and ask for an update.  If you are proceeding as we expect, OK to cancel the neurology appointment.   Continue artificial tears and taping your eye shut.   Let us  know if you need anything.

## 2023-08-29 NOTE — Progress Notes (Signed)
 Chief Complaint  Patient presents with   Follow-up    Follow up    Subjective: Patient is a 43 y.o. male here for ER follow-up.  Patient diagnosed with Bell's palsy, went to the ED on 8/8.  Imaging was unremarkable.  He was started on high-dose prednisone  for 7 days and Valtrex  1 g 3 times daily for 7 days.  He completed the course.  It made him feel diffusely weak and irritable.  This is steadily improving since coming off of the medication.  His facial droop is around 40-50% improved since treatment.  He is using artificial tears and taping his eyes at night.  Past Medical History:  Diagnosis Date   GERD (gastroesophageal reflux disease)     Objective: BP 130/86 (BP Location: Left Arm, Patient Position: Sitting)   Pulse 95   Temp 98 F (36.7 C) (Oral)   Resp 16   Ht 5' 11 (1.803 m)   Wt 187 lb 6.4 oz (85 kg)   SpO2 98%   BMI 26.14 kg/m  General: Awake, appears stated age Neuro: Right-sided facial droop noted.  Cranial nerves II through XII intact with the exception of 7.  5/5 strength throughout.  Gait is normal.  DTRs equal and symmetric throughout.  No clonus.  No cerebellar signs. Lungs: No accessory muscle use Psych: Age appropriate judgment and insight, normal affect and mood  Assessment and Plan: Bell palsy - Plan: Ambulatory referral to Neurology  Refer to neurology as a contingency.  90% of people have significant improvement by week 3.  If this is not him, he will keep the appointment.  The artificial tears and taping eye shut at night.  Follow-up as originally scheduled. The patient voiced understanding and agreement to the plan.  Mabel Mt [place], DO 08/29/23  3:22 PM

## 2023-11-03 ENCOUNTER — Ambulatory Visit: Payer: Managed Care, Other (non HMO) | Admitting: Family Medicine

## 2023-11-03 ENCOUNTER — Encounter: Payer: Self-pay | Admitting: Family Medicine

## 2023-11-03 ENCOUNTER — Ambulatory Visit: Payer: Self-pay | Admitting: Family Medicine

## 2023-11-03 VITALS — BP 124/80 | HR 78 | Temp 98.0°F | Resp 16 | Ht 71.0 in | Wt 187.0 lb

## 2023-11-03 DIAGNOSIS — H9192 Unspecified hearing loss, left ear: Secondary | ICD-10-CM | POA: Diagnosis not present

## 2023-11-03 DIAGNOSIS — Z0000 Encounter for general adult medical examination without abnormal findings: Secondary | ICD-10-CM | POA: Diagnosis not present

## 2023-11-03 DIAGNOSIS — R438 Other disturbances of smell and taste: Secondary | ICD-10-CM | POA: Diagnosis not present

## 2023-11-03 DIAGNOSIS — M25562 Pain in left knee: Secondary | ICD-10-CM

## 2023-11-03 DIAGNOSIS — R070 Pain in throat: Secondary | ICD-10-CM | POA: Diagnosis not present

## 2023-11-03 DIAGNOSIS — Z23 Encounter for immunization: Secondary | ICD-10-CM

## 2023-11-03 LAB — COMPREHENSIVE METABOLIC PANEL WITH GFR
ALT: 13 U/L (ref 0–53)
AST: 14 U/L (ref 0–37)
Albumin: 4.5 g/dL (ref 3.5–5.2)
Alkaline Phosphatase: 62 U/L (ref 39–117)
BUN: 14 mg/dL (ref 6–23)
CO2: 28 meq/L (ref 19–32)
Calcium: 9.5 mg/dL (ref 8.4–10.5)
Chloride: 105 meq/L (ref 96–112)
Creatinine, Ser: 1 mg/dL (ref 0.40–1.50)
GFR: 92.16 mL/min (ref >60.00)
Glucose, Bld: 91 mg/dL (ref 70–99)
Potassium: 4.2 meq/L (ref 3.5–5.1)
Sodium: 142 meq/L (ref 135–145)
Total Bilirubin: 1.4 mg/dL — ABNORMAL HIGH (ref 0.2–1.2)
Total Protein: 7.1 g/dL (ref 6.0–8.3)

## 2023-11-03 LAB — CBC
HCT: 42.7 % (ref 39.0–52.0)
Hemoglobin: 14.6 g/dL (ref 13.0–17.0)
MCHC: 34.2 g/dL (ref 30.0–36.0)
MCV: 82.2 fl (ref 78.0–100.0)
Platelets: 302 K/uL (ref 150.0–400.0)
RBC: 5.19 Mil/uL (ref 4.22–5.81)
RDW: 14.2 % (ref 11.5–15.5)
WBC: 4.5 K/uL (ref 4.0–10.5)

## 2023-11-03 LAB — LIPID PANEL
Cholesterol: 147 mg/dL (ref 0–200)
HDL: 33.8 mg/dL — ABNORMAL LOW (ref >39.00)
LDL Cholesterol: 85 mg/dL (ref 0–99)
NonHDL: 113.53
Total CHOL/HDL Ratio: 4
Triglycerides: 142 mg/dL (ref 0.0–149.0)
VLDL: 28.4 mg/dL (ref 0.0–40.0)

## 2023-11-03 LAB — VITAMIN D 25 HYDROXY (VIT D DEFICIENCY, FRACTURES): VITD: 34.86 ng/mL (ref 30.00–100.00)

## 2023-11-03 MED ORDER — FLUTICASONE PROPIONATE 50 MCG/ACT NA SUSP: 2.0000 | Freq: Every day | NASAL | 2 refills | Status: AC

## 2023-11-03 NOTE — Patient Instructions (Addendum)
 Give us  2-3 business days to get the results of your labs back.   Keep the diet clean and stay active.  Please get me a copy of your advanced directive form at your convenience.   If you do not hear anything about your referral in the next 1-2 weeks, call our office and ask for an update.  Foods that may reduce pain: 1) Ginger 2) Blueberries 3) Salmon 4) Pumpkin seeds 5) Dark chocolate 6) Turmeric 7) Tart cherries 8) Virgin olive oil 9) Chili peppers 10) Mint 11) Krill oil  Let us  know if you need anything.  Knee Exercises It is normal to feel mild stretching, pulling, tightness, or discomfort as you do these exercises, but you should stop right away if you feel sudden pain or your pain gets worse.  STRETCHING AND RANGE OF MOTION EXERCISES  These exercises warm up your muscles and joints and improve the movement and flexibility of your knee. These exercises also help to relieve pain, numbness, and tingling. Exercise A: Knee Extension, Prone  Lie on your abdomen on a bed. Place your left / right knee just beyond the edge of the surface so your knee is not on the bed. You can put a towel under your left / right thigh just above your knee for comfort. Relax your leg muscles and allow gravity to straighten your knee. You should feel a stretch behind your left / right knee. Hold this position for 30 seconds. Scoot up so your knee is supported between repetitions. Repeat 2 times. Complete this stretch 3 times per week. Exercise B: Knee Flexion, Active     Lie on your back with both knees straight. If this causes back discomfort, bend your left / right knee so your foot is flat on the floor. Slowly slide your left / right heel back toward your buttocks until you feel a gentle stretch in the front of your knee or thigh. Hold this position for 30 seconds. Slowly slide your left / right heel back to the starting position. Repeat 2 times. Complete this exercise 3 times per  week. Exercise C: Quadriceps, Prone     Lie on your abdomen on a firm surface, such as a bed or padded floor. Bend your left / right knee and hold your ankle. If you cannot reach your ankle or pant leg, loop a belt around your foot and grab the belt instead. Gently pull your heel toward your buttocks. Your knee should not slide out to the side. You should feel a stretch in the front of your thigh and knee. Hold this position for 30 seconds. Repeat 2 times. Complete this stretch 3 times per week. Exercise D: Hamstring, Supine  Lie on your back. Loop a belt or towel over the ball of your left / right foot. The ball of your foot is on the walking surface, right under your toes. Straighten your left / right knee and slowly pull on the belt to raise your leg until you feel a gentle stretch behind your knee. Do not let your left / right knee bend while you do this. Keep your other leg flat on the floor. Hold this position for 30 seconds. Repeat 2 times. Complete this stretch 3 times per week. STRENGTHENING EXERCISES  These exercises build strength and endurance in your knee. Endurance is the ability to use your muscles for a long time, even after they get tired. Exercise E: Quadriceps, Isometric     Lie on your back with  your left / right leg extended and your other knee bent. Put a rolled towel or small pillow under your knee if told by your health care provider. Slowly tense the muscles in the front of your left / right thigh. You should see your kneecap slide up toward your hip or see increased dimpling just above the knee. This motion will push the back of the knee toward the floor. For 3 seconds, keep the muscle as tight as you can without increasing your pain. Relax the muscles slowly and completely. Repeat for 10 total reps Repeat 2 ti mes. Complete this exercise 3 times per week. Exercise F: Straight Leg Raises - Quadriceps  Lie on your back with your left / right leg extended and your  other knee bent. Tense the muscles in the front of your left / right thigh. You should see your kneecap slide up or see increased dimpling just above the knee. Your thigh may even shake a bit. Keep these muscles tight as you raise your leg 4-6 inches (10-15 cm) off the floor. Do not let your knee bend. Hold this position for 3 seconds. Keep these muscles tense as you lower your leg. Relax your muscles slowly and completely after each repetition. 10 total reps. Repeat 2 times. Complete this exercise 3 times per week.  Exercise G: Hamstring Curls     If told by your health care provider, do this exercise while wearing ankle weights. Begin with 5 lb weights (optional). Then increase the weight by 1 lb (0.5 kg) increments. Do not wear ankle weights that are more than 20 lbs to start with. Lie on your abdomen with your legs straight. Bend your left / right knee as far as you can without feeling pain. Keep your hips flat against the floor. Hold this position for 3 seconds. Slowly lower your leg to the starting position. Repeat for 10 reps.  Repeat 2 times. Complete this exercise 3 times per week. Exercise H: Squats (Quadriceps)  Stand in front of a table, with your feet and knees pointing straight ahead. You may rest your hands on the table for balance but not for support. Slowly bend your knees and lower your hips like you are going to sit in a chair. Keep your weight over your heels, not over your toes. Keep your lower legs upright so they are parallel with the table legs. Do not let your hips go lower than your knees. Do not bend lower than told by your health care provider. If your knee pain increases, do not bend as low. Hold the squat position for 1 second. Slowly push with your legs to return to standing. Do not use your hands to pull yourself to standing. Repeat 2 times. Complete this exercise 3 times per week. Exercise I: Wall Slides (Quadriceps)     Lean your back against a smooth  wall or door while you walk your feet out 18-24 inches (46-61 cm) from it. Place your feet hip-width apart. Slowly slide down the wall or door until your knees Repeat 2 times. Complete this exercise every other day. Exercise K: Straight Leg Raises - Hip Abductors  Lie on your side with your left / right leg in the top position. Lie so your head, shoulder, knee, and hip line up. You may bend your bottom knee to help you keep your balance. Roll your hips slightly forward so your hips are stacked directly over each other and your left / right knee is facing forward.  Leading with your heel, lift your top leg 4-6 inches (10-15 cm). You should feel the muscles in your outer hip lifting. Do not let your foot drift forward. Do not let your knee roll toward the ceiling. Hold this position for 3 seconds. Slowly return your leg to the starting position. Let your muscles relax completely after each repetition. 10 total reps. Repeat 2 times. Complete this exercise 3 times per week. Exercise J: Straight Leg Raises - Hip Extensors  Lie on your abdomen on a firm surface. You can put a pillow under your hips if that is more comfortable. Tense the muscles in your buttocks and lift your left / right leg about 4-6 inches (10-15 cm). Keep your knee straight as you lift your leg. Hold this position for 3 seconds. Slowly lower your leg to the starting position. Let your leg relax completely after each repetition. Repeat 2 times. Complete this exercise 3 times per week. Document Released: 11/10/2004 Document Revised: 09/21/2015 Document Reviewed: 11/02/2014 Elsevier Interactive Patient Education  2017 [employer].

## 2023-11-03 NOTE — Progress Notes (Signed)
 Chief Complaint  Patient presents with   Annual Exam    CPE    [name] is here for a complete physical.   His last physical was >1 year ago.  Current diet: in general, a healthy diet.   Current exercise: stretches, rebounding (trampoline-like exercising), walking Weight trend: stable Fatigue out of ordinary? No. Seat belt? Yes.   Advanced directive? No  Health maintenance Tetanus- Yes HIV- Yes Hep C- Yes  Patient continues to have a feeling of something in his throat on the left side.  He had this last year but it was intermittent and we decided to keep an eye on things.  Is happening more frequently now.  Things are not getting stuck.  He is not having any pain necessarily.  Associated decreased hearing intermittently on the left.  He also has a metallic taste in his mouth.  There is no correlation with meals or position.  He has not tried any medication for this at home.  He does take omeprazole  20 mg daily.  The patient has had left knee pain over the past few months.  No injury or change in activity.  Seems to bother him more when the weather changes.  He does not always stretch routinely.  No dedicated strength training.  No bruising, redness, or swelling.  He has not tried anything at home so far.  Past Medical History:  Diagnosis Date   GERD (gastroesophageal reflux disease)      Past Surgical History:  Procedure Laterality Date   NO PAST SURGERIES      Medications  Current Outpatient Medications on File Prior to Visit  Medication Sig Dispense Refill   Multiple Vitamin (MULTIVITAMIN ADULT PO) Take 1 tablet by mouth daily.     omeprazole  (PRILOSEC) 20 MG capsule TAKE 1 CAPSULE BY MOUTH EVERY DAY 90 capsule 3   Allergies No Known Allergies  Family History Family History  Problem Relation Age of Onset   Diabetes Mother    Hypertension Father     Review of Systems: Constitutional: no fevers or chills Eye:  no recent significant change in  vision Ear/Nose/Mouth/Throat:  Ears: As noted in HPI Nose/Mouth/Throat: As noted in HPI Cardiovascular:  no chest pain Respiratory:  no shortness of breath Gastrointestinal:  no abdominal pain, no change in bowel habits GU:  Male: negative for dysuria, frequency, and incontinence Musculoskeletal/Extremities:  + Left knee pain Integumentary (Skin/Breast):  no abnormal skin lesions reported Neurologic:  no headaches Endocrine: No unexpected weight changes Hematologic/Lymphatic:  no night sweats  Exam BP 124/80 (BP Location: Left Arm, Patient Position: Sitting)   Pulse 78   Temp 98 F (36.7 C) (Oral)   Resp 16   Ht 5' 11 (1.803 m)   Wt 187 lb (84.8 kg)   SpO2 98%   BMI 26.08 kg/m  General:  well developed, well nourished, in no apparent distress Skin:  no significant moles, warts, or growths Head:  no masses, lesions, or tenderness Eyes:  pupils equal and round, sclera anicteric without injection Ears:  canals without lesions, TMs shiny without retraction, no obvious effusion, no erythema Nose:  nares patent, mucosa normal Throat/Pharynx:  lips and gingiva without lesion; tongue and uvula midline; non-inflamed pharynx; no exudates, + postnasal drainage Neck: neck supple without adenopathy, thyromegaly, or masses Lungs:  clear to auscultation, breath sounds equal bilaterally, no respiratory distress Cardio:  regular rate and rhythm, no bruits, no LE edema Abdomen:  abdomen soft, nontender; bowel sounds normal;  no masses or organomegaly Rectal: Deferred Musculoskeletal: Some maltracking of the patella when putting him through his range of motion of the left knee.  Negative varus/valgus stress, Lachman's, patellar apprehension/grind, McMurray's.  There is no bony or joint line tenderness.  There is no effusion, ecchymosis, erythema, excessive warmth, or edema.  Otherwise symmetrical muscle groups noted without atrophy or deformity Extremities:  no clubbing, cyanosis, or edema, no  deformities, no skin discoloration Neuro:  gait normal; deep tendon reflexes normal and symmetric Psych: well oriented with normal range of affect and appropriate judgment/insight  Assessment and Plan  Well adult exam - Plan: CBC, Comprehensive metabolic panel with GFR, Hepatitis B surface antibody,quantitative, Lipid panel, VITAMIN D 25 Hydroxy (Vit-D Deficiency, Fractures)  Need for influenza vaccination - Plan: Flu vaccine trivalent PF, 6mos and older(Flulaval,Afluria,Fluarix,Fluzone)  Hearing loss of left ear, unspecified hearing loss type  Metallic taste - Plan: Ambulatory referral to ENT  Throat discomfort - Plan: Ambulatory referral to ENT  Patellofemoral arthralgia of left knee   [name]. Counseled on diet and exercise. Screen hep B. Advanced directive form provided today.  Flu shot today. Hearing loss/taste/throat discomfort; refer to ENT.  Trial Flonase until he gets in. Knee pain: Seems to be patellofemoral syndrome.  He seems pretty young and lean for this to be arthritis.  Would check an x-ray if no significant improvement with stretches/exercises in the next month. Other orders as above. Follow up in 1 yr or prn.   The patient voiced understanding and agreement to the plan.  Harold [place], DO 11/03/23 8:43 AM

## 2023-11-04 LAB — HEPATITIS B SURFACE ANTIBODY, QUANTITATIVE: Hep B S AB Quant (Post): >1000 m[IU]/mL (ref >=10)

## 2023-12-13 ENCOUNTER — Institutional Professional Consult (permissible substitution) (INDEPENDENT_AMBULATORY_CARE_PROVIDER_SITE_OTHER)

## 2023-12-22 ENCOUNTER — Other Ambulatory Visit: Payer: Self-pay | Admitting: Family Medicine

## 2023-12-22 ENCOUNTER — Ambulatory Visit (INDEPENDENT_AMBULATORY_CARE_PROVIDER_SITE_OTHER): Admitting: Otolaryngology

## 2023-12-22 ENCOUNTER — Encounter (INDEPENDENT_AMBULATORY_CARE_PROVIDER_SITE_OTHER): Payer: Self-pay | Admitting: Otolaryngology

## 2023-12-22 VITALS — BP 129/89 | HR 69 | Ht 71.0 in | Wt 183.0 lb

## 2023-12-22 DIAGNOSIS — R432 Parageusia: Secondary | ICD-10-CM

## 2023-12-22 DIAGNOSIS — K219 Gastro-esophageal reflux disease without esophagitis: Secondary | ICD-10-CM

## 2023-12-22 MED ORDER — PANTOPRAZOLE SODIUM 40 MG PO TBEC: 40.0000 mg | DELAYED_RELEASE_TABLET | Freq: Two times a day (BID) | ORAL | 1 refills | Status: AC

## 2023-12-22 NOTE — Progress Notes (Signed)
 Reason for Consult: Taste alteration Referring Physician: Dr. Wendling  Harold Molina is an 43 y.o. male.  HPI: History of a metal taste in the left side of his throat.  He has had this for years.  Perhaps it is a little worse currently.  It does fluctuate.  Many times it is totally gone.  He has had some dental issues that is being treated with gingivitis.  He also has significant reflux problems and is having issues even with the once a day omeprazole .  He has had blood work and apparently does not have any kidney or liver issues.  His B12 was normal.  He does not have diabetes.  He is not having any swelling in his neck.  He had a CT scan which shows no masses in his neck and no sinusitis.  He does not seem to have any exposure to heavy metals.   Past Medical History:  Diagnosis Date   GERD (gastroesophageal reflux disease)     Past Surgical History:  Procedure Laterality Date   NO PAST SURGERIES      Family History  Problem Relation Age of Onset   Diabetes Mother    Hypertension Father     Social History:  reports that he has quit smoking. He quit smokeless tobacco use about 7 years ago.  His smokeless tobacco use included chew. He reports that he does not drink alcohol and does not use drugs.  Allergies: Allergies[1]   No results found for this or any previous visit (from the past 48 hours).  No results found.  ROS Height 5' 11 (1.803 m), weight 183 lb (83 kg). Physical Exam Constitutional:      Appearance: Normal appearance.  HENT:     Head: Normocephalic and atraumatic.     Right Ear: Tympanic membrane is without lesions and middle ear aerated, ear canal and external ear normal.     Left Ear: Tympanic membrane is without lesions and middle ear aerated, ear canal and external ear normal.     Nose: Nose without deviation of septum.  Turbinates with mild hypertrophy, No significant swelling or masses.     Oral cavity/oropharynx: Mucous membranes are moist. No lesions or  masses    Larynx: normal voice. Mirror attempted without success    Eyes:     Extraocular Movements: Extraocular movements intact.     Conjunctiva/sclera: Conjunctivae normal.     Pupils: Pupils are equal, round, and reactive to light.  Cardiovascular:     Rate and Rhythm: Normal rate.  Pulmonary:     Effort: Pulmonary effort is normal.  Musculoskeletal:     Cervical back: Normal range of motion and neck supple. No rigidity.  Lymphadenopathy:     Cervical: No cervical adenopathy or masses.salivary glands without lesions. .     Salivary glands- no mass or swelling Neurological:     Mental Status: He is alert. CN 2-12 intact. No nystagmus      Assessment/Plan: Dysgeusia-most likely given all the workup he has had that this is probably reflux.  A low-grade tonsil infection with cryptic tonsillitis could also be a possibility.  For right now I think trying him on a more aggressive reflux regiment would be the best first choice so we will switch to Protonix twice daily and see him back in 1 to 2 months.  Then consideration of an antibiotic treatment for possible tonsillitis.  Norleen Notice 12/22/2023, 8:25 AM        [1] No Known Allergies

## 2024-02-20 ENCOUNTER — Ambulatory Visit (INDEPENDENT_AMBULATORY_CARE_PROVIDER_SITE_OTHER): Admitting: Otolaryngology

## 2024-11-05 ENCOUNTER — Encounter: Admitting: Family Medicine
